# Patient Record
Sex: Female | Born: 1945 | Race: White | Hispanic: No | Marital: Married | Smoking: Never smoker
Health system: Southern US, Community
[De-identification: ages and names within clinical notes are randomized; demographics above are authoritative.]

## PROBLEM LIST (undated history)

## (undated) DIAGNOSIS — R112 Nausea with vomiting, unspecified: Secondary | ICD-10-CM

## (undated) DIAGNOSIS — J189 Pneumonia, unspecified organism: Secondary | ICD-10-CM

## (undated) DIAGNOSIS — Z9889 Other specified postprocedural states: Secondary | ICD-10-CM

## (undated) DIAGNOSIS — R252 Cramp and spasm: Secondary | ICD-10-CM

## (undated) DIAGNOSIS — E785 Hyperlipidemia, unspecified: Secondary | ICD-10-CM

## (undated) DIAGNOSIS — T8859XA Other complications of anesthesia, initial encounter: Secondary | ICD-10-CM

## (undated) DIAGNOSIS — K269 Duodenal ulcer, unspecified as acute or chronic, without hemorrhage or perforation: Secondary | ICD-10-CM

## (undated) DIAGNOSIS — M1990 Unspecified osteoarthritis, unspecified site: Secondary | ICD-10-CM

## (undated) DIAGNOSIS — M1711 Unilateral primary osteoarthritis, right knee: Secondary | ICD-10-CM

## (undated) DIAGNOSIS — R42 Dizziness and giddiness: Secondary | ICD-10-CM

## (undated) DIAGNOSIS — Z9289 Personal history of other medical treatment: Secondary | ICD-10-CM

## (undated) DIAGNOSIS — T4145XA Adverse effect of unspecified anesthetic, initial encounter: Secondary | ICD-10-CM

## (undated) DIAGNOSIS — E559 Vitamin D deficiency, unspecified: Secondary | ICD-10-CM

## (undated) HISTORY — DX: Vitamin D deficiency, unspecified: E55.9

## (undated) HISTORY — DX: Personal history of other medical treatment: Z92.89

## (undated) HISTORY — PX: LAPAROSCOPIC CHOLECYSTECTOMY: SUR755

## (undated) HISTORY — DX: Unspecified osteoarthritis, unspecified site: M19.90

## (undated) HISTORY — DX: Hyperlipidemia, unspecified: E78.5

## (undated) HISTORY — PX: JOINT REPLACEMENT: SHX530

## (undated) HISTORY — PX: TONSILLECTOMY: SUR1361

---

## 1998-12-22 ENCOUNTER — Other Ambulatory Visit: Admission: RE | Admit: 1998-12-22 | Discharge: 1998-12-22 | Payer: Self-pay | Admitting: Obstetrics and Gynecology

## 1999-08-09 ENCOUNTER — Other Ambulatory Visit: Admission: RE | Admit: 1999-08-09 | Discharge: 1999-08-09 | Payer: Self-pay | Admitting: Obstetrics and Gynecology

## 1999-08-09 ENCOUNTER — Encounter (INDEPENDENT_AMBULATORY_CARE_PROVIDER_SITE_OTHER): Payer: Self-pay | Admitting: Specialist

## 2000-01-23 ENCOUNTER — Other Ambulatory Visit: Admission: RE | Admit: 2000-01-23 | Discharge: 2000-01-23 | Payer: Self-pay | Admitting: Obstetrics and Gynecology

## 2000-03-13 ENCOUNTER — Encounter: Admission: RE | Admit: 2000-03-13 | Discharge: 2000-03-13 | Payer: Self-pay | Admitting: Obstetrics and Gynecology

## 2000-03-13 ENCOUNTER — Encounter: Payer: Self-pay | Admitting: Obstetrics and Gynecology

## 2000-03-16 ENCOUNTER — Encounter: Admission: RE | Admit: 2000-03-16 | Discharge: 2000-03-16 | Payer: Self-pay | Admitting: Obstetrics and Gynecology

## 2000-03-16 ENCOUNTER — Encounter: Payer: Self-pay | Admitting: Obstetrics and Gynecology

## 2001-01-14 ENCOUNTER — Other Ambulatory Visit: Admission: RE | Admit: 2001-01-14 | Discharge: 2001-01-14 | Payer: Self-pay | Admitting: Obstetrics and Gynecology

## 2001-05-17 ENCOUNTER — Encounter: Admission: RE | Admit: 2001-05-17 | Discharge: 2001-05-17 | Payer: Self-pay | Admitting: Obstetrics and Gynecology

## 2001-05-17 ENCOUNTER — Encounter: Payer: Self-pay | Admitting: Obstetrics and Gynecology

## 2002-01-29 ENCOUNTER — Other Ambulatory Visit: Admission: RE | Admit: 2002-01-29 | Discharge: 2002-01-29 | Payer: Self-pay | Admitting: Obstetrics and Gynecology

## 2002-09-25 ENCOUNTER — Encounter: Payer: Self-pay | Admitting: Obstetrics and Gynecology

## 2002-09-25 ENCOUNTER — Encounter: Admission: RE | Admit: 2002-09-25 | Discharge: 2002-09-25 | Payer: Self-pay | Admitting: Obstetrics and Gynecology

## 2003-04-20 ENCOUNTER — Other Ambulatory Visit: Admission: RE | Admit: 2003-04-20 | Discharge: 2003-04-20 | Payer: Self-pay | Admitting: Obstetrics and Gynecology

## 2003-11-23 ENCOUNTER — Encounter: Admission: RE | Admit: 2003-11-23 | Discharge: 2003-11-23 | Payer: Self-pay | Admitting: Obstetrics and Gynecology

## 2004-08-15 ENCOUNTER — Other Ambulatory Visit: Admission: RE | Admit: 2004-08-15 | Discharge: 2004-08-15 | Payer: Self-pay | Admitting: Obstetrics and Gynecology

## 2005-06-07 ENCOUNTER — Encounter: Admission: RE | Admit: 2005-06-07 | Discharge: 2005-06-07 | Payer: Self-pay | Admitting: Obstetrics and Gynecology

## 2005-06-07 ENCOUNTER — Ambulatory Visit: Payer: Self-pay | Admitting: Internal Medicine

## 2005-06-15 ENCOUNTER — Ambulatory Visit: Payer: Self-pay | Admitting: Internal Medicine

## 2005-07-03 ENCOUNTER — Ambulatory Visit: Payer: Self-pay

## 2005-10-12 ENCOUNTER — Other Ambulatory Visit: Admission: RE | Admit: 2005-10-12 | Discharge: 2005-10-12 | Payer: Self-pay | Admitting: Obstetrics and Gynecology

## 2005-11-06 HISTORY — PX: COLONOSCOPY: SHX174

## 2006-01-29 ENCOUNTER — Ambulatory Visit: Payer: Self-pay | Admitting: Internal Medicine

## 2006-07-18 ENCOUNTER — Encounter: Admission: RE | Admit: 2006-07-18 | Discharge: 2006-07-18 | Payer: Self-pay | Admitting: Internal Medicine

## 2006-07-24 ENCOUNTER — Ambulatory Visit: Payer: Self-pay | Admitting: Gastroenterology

## 2006-07-30 ENCOUNTER — Ambulatory Visit: Payer: Self-pay | Admitting: Gastroenterology

## 2006-08-16 ENCOUNTER — Ambulatory Visit: Payer: Self-pay | Admitting: Gastroenterology

## 2007-02-08 ENCOUNTER — Ambulatory Visit: Payer: Self-pay | Admitting: Internal Medicine

## 2007-03-20 ENCOUNTER — Encounter: Payer: Self-pay | Admitting: Internal Medicine

## 2007-03-20 ENCOUNTER — Ambulatory Visit: Payer: Self-pay | Admitting: Internal Medicine

## 2007-03-20 LAB — CONVERTED CEMR LAB
Alkaline Phosphatase: 64 units/L (ref 39–117)
Basophils Relative: 0.2 % (ref 0.0–1.0)
CO2: 30 meq/L (ref 19–32)
Chloride: 110 meq/L (ref 96–112)
Cholesterol: 244 mg/dL (ref 0–200)
Creatinine, Ser: 0.8 mg/dL (ref 0.4–1.2)
Eosinophils Absolute: 0.1 10*3/uL (ref 0.0–0.6)
GFR calc non Af Amer: 78 mL/min
HCT: 41.3 % (ref 36.0–46.0)
HDL: 45.5 mg/dL (ref >39.0)
Hemoglobin: 14.5 g/dL (ref 12.0–15.0)
Lymphocytes Relative: 39 % (ref 12.0–46.0)
MCHC: 35.1 g/dL (ref 30.0–36.0)
MCV: 92.4 fL (ref 78.0–100.0)
Neutro Abs: 2.9 10*3/uL (ref 1.4–7.7)
Neutrophils Relative %: 51.7 % (ref 43.0–77.0)
Platelets: 322 10*3/uL (ref 150–400)
Potassium: 4.6 meq/L (ref 3.5–5.1)
RDW: 11.9 % (ref 11.5–14.6)
Sodium: 144 meq/L (ref 135–145)
Total Bilirubin: 0.8 mg/dL (ref 0.3–1.2)
Triglycerides: 178 mg/dL — ABNORMAL HIGH (ref 0–149)

## 2007-06-27 ENCOUNTER — Ambulatory Visit: Payer: Self-pay | Admitting: Internal Medicine

## 2007-07-02 ENCOUNTER — Encounter (INDEPENDENT_AMBULATORY_CARE_PROVIDER_SITE_OTHER): Payer: Self-pay | Admitting: *Deleted

## 2007-07-02 LAB — CONVERTED CEMR LAB
Cholesterol: 135 mg/dL (ref 0–200)
HDL: 45.5 mg/dL (ref >39.0)
LDL Cholesterol: 67 mg/dL (ref 0–99)
Total CHOL/HDL Ratio: 3
Total CK: 98 units/L (ref 7–177)
VLDL: 23 mg/dL (ref 0–40)

## 2007-07-04 ENCOUNTER — Ambulatory Visit: Payer: Self-pay | Admitting: Internal Medicine

## 2007-07-04 DIAGNOSIS — E785 Hyperlipidemia, unspecified: Secondary | ICD-10-CM | POA: Insufficient documentation

## 2007-07-04 LAB — CONVERTED CEMR LAB: Cholesterol, target level: 200 mg/dL

## 2007-10-10 ENCOUNTER — Encounter: Admission: RE | Admit: 2007-10-10 | Discharge: 2007-10-10 | Payer: Self-pay | Admitting: Obstetrics and Gynecology

## 2007-10-10 ENCOUNTER — Encounter: Payer: Self-pay | Admitting: Internal Medicine

## 2009-01-01 ENCOUNTER — Encounter: Admission: RE | Admit: 2009-01-01 | Discharge: 2009-01-01 | Payer: Self-pay | Admitting: Orthopedic Surgery

## 2009-03-30 ENCOUNTER — Ambulatory Visit: Payer: Self-pay | Admitting: Internal Medicine

## 2009-03-30 DIAGNOSIS — IMO0001 Reserved for inherently not codable concepts without codable children: Secondary | ICD-10-CM

## 2009-03-30 DIAGNOSIS — E559 Vitamin D deficiency, unspecified: Secondary | ICD-10-CM | POA: Insufficient documentation

## 2009-07-05 ENCOUNTER — Ambulatory Visit: Payer: Self-pay | Admitting: Internal Medicine

## 2009-07-13 ENCOUNTER — Encounter (INDEPENDENT_AMBULATORY_CARE_PROVIDER_SITE_OTHER): Payer: Self-pay | Admitting: *Deleted

## 2009-07-13 LAB — CONVERTED CEMR LAB
ALT: 16 units/L (ref 0–35)
Albumin: 4.2 g/dL (ref 3.5–5.2)
BUN: 15 mg/dL (ref 6–23)
Basophils Absolute: 0 10*3/uL (ref 0.0–0.1)
Chloride: 109 meq/L (ref 96–112)
Creatinine, Ser: 0.7 mg/dL (ref 0.4–1.2)
GFR calc non Af Amer: 89.9 mL/min (ref >60)
Glucose, Bld: 89 mg/dL (ref 70–99)
Hemoglobin: 13.9 g/dL (ref 12.0–15.0)
Lymphocytes Relative: 37 % (ref 12.0–46.0)
Lymphs Abs: 2.1 10*3/uL (ref 0.7–4.0)
MCHC: 34.4 g/dL (ref 30.0–36.0)
Monocytes Absolute: 0.4 10*3/uL (ref 0.1–1.0)
Potassium: 4.4 meq/L (ref 3.5–5.1)
Sodium: 143 meq/L (ref 135–145)
Total Bilirubin: 0.9 mg/dL (ref 0.3–1.2)

## 2009-07-20 ENCOUNTER — Ambulatory Visit: Payer: Self-pay | Admitting: Internal Medicine

## 2009-07-20 DIAGNOSIS — M1990 Unspecified osteoarthritis, unspecified site: Secondary | ICD-10-CM | POA: Insufficient documentation

## 2009-09-06 HISTORY — PX: KNEE ARTHROSCOPY: SUR90

## 2009-11-06 DIAGNOSIS — Z9289 Personal history of other medical treatment: Secondary | ICD-10-CM

## 2009-11-06 HISTORY — DX: Personal history of other medical treatment: Z92.89

## 2009-12-02 ENCOUNTER — Encounter: Admission: RE | Admit: 2009-12-02 | Discharge: 2009-12-02 | Payer: Self-pay | Admitting: Orthopedic Surgery

## 2010-04-18 ENCOUNTER — Encounter: Admission: RE | Admit: 2010-04-18 | Discharge: 2010-04-18 | Payer: Self-pay | Admitting: Orthopedic Surgery

## 2010-05-27 ENCOUNTER — Ambulatory Visit: Payer: Self-pay | Admitting: Internal Medicine

## 2010-05-30 LAB — CONVERTED CEMR LAB
ALT: 22 units/L (ref 0–35)
AST: 27 units/L (ref 0–37)
Albumin: 4.1 g/dL (ref 3.5–5.2)
Alkaline Phosphatase: 71 units/L (ref 39–117)
Bilirubin, Direct: 0.1 mg/dL (ref 0.0–0.3)
Cholesterol: 170 mg/dL (ref 0–200)
HDL: 53.3 mg/dL (ref >39.00)
Sed Rate: 10 mm/hr (ref 0–22)
Total CHOL/HDL Ratio: 3
Triglycerides: 134 mg/dL (ref 0.0–149.0)
VLDL: 26.8 mg/dL (ref 0.0–40.0)
Vit D, 25-Hydroxy: 31 ng/mL (ref 30–89)

## 2010-05-31 ENCOUNTER — Telehealth (INDEPENDENT_AMBULATORY_CARE_PROVIDER_SITE_OTHER): Payer: Self-pay | Admitting: *Deleted

## 2010-06-30 ENCOUNTER — Telehealth (INDEPENDENT_AMBULATORY_CARE_PROVIDER_SITE_OTHER): Payer: Self-pay | Admitting: *Deleted

## 2010-06-30 ENCOUNTER — Encounter (INDEPENDENT_AMBULATORY_CARE_PROVIDER_SITE_OTHER): Payer: Self-pay | Admitting: *Deleted

## 2010-06-30 ENCOUNTER — Encounter: Payer: Self-pay | Admitting: Internal Medicine

## 2010-07-04 ENCOUNTER — Encounter (INDEPENDENT_AMBULATORY_CARE_PROVIDER_SITE_OTHER): Payer: Self-pay | Admitting: Orthopedic Surgery

## 2010-07-04 ENCOUNTER — Inpatient Hospital Stay (HOSPITAL_COMMUNITY): Admission: RE | Admit: 2010-07-04 | Discharge: 2010-07-07 | Payer: Self-pay | Admitting: Orthopedic Surgery

## 2010-07-05 HISTORY — PX: TOTAL HIP ARTHROPLASTY: SHX124

## 2010-11-27 ENCOUNTER — Encounter: Payer: Self-pay | Admitting: Internal Medicine

## 2010-12-08 NOTE — Progress Notes (Signed)
Summary: Mis-Placed RX  Phone Note Call from Patient Call back at Home Phone [phone]   Caller: Patient Summary of Call: Patient mis-placed rx for meloxicam and would like another rx.  Patient informed rx will be avaliable after noon./Chrae [facility] CMA  May 31, 2010 9:42 AM     Prescriptions: MELOXICAM 15 MG TABS (MELOXICAM) 1 by mouth once daily  #30 x 2   Entered by:   Shonna Chock CMA   Authorized by:   Marga Melnick MD   Signed by:   Shonna Chock CMA on 05/31/2010   Method used:   Print then Give to Patient   RxID:   [id number]

## 2010-12-08 NOTE — Assessment & Plan Note (Signed)
Summary: hip pain/knee pain/cbs   Vital Signs:  Patient profile:   65 year old female Weight:      178 pounds BMI:     31.65 Temp:     98.4 degrees F oral Pulse rate:   80 / minute Resp:     15 per minute BP sitting:   122 / 80  (left arm) Cuff size:   large  Vitals Entered By: Shonna Chock CMA (May 27, 2010 8:15 AM) CC: Knee and him pain, Lower Extremity Joint pain   CC:  Knee and him pain and Lower Extremity Joint pain.  History of Present Illness:  Lower Extremity Joint Pains      This is a 65 year old woman who presents with Lower Extremity Joint pains, present 11 months, progressive over past 3 months.  The patient reports R knee  giving way  ,swelling &  popping L knee, stiffness for >1 hr, decreased ROM, and weakness, but denies redness,  and locking.  The pain began gradually ; she tore L knee meniscus from  injury climbing ladder & twisting it.  The pains are  described as variable from sharp to  dull but is constant, but   mainly activity exacerbated.  Evaluation to date has included plain X-rays, MRI scan, and Orthopedic consult. Atrthroscopy L knee 09/2009 by Dr. Thurston Hole. He described "bone on bone".  The patient denies the following symptoms: fever, rash, photosensitivity, eye symptoms, diarrhea, and dysuria.  Rx:S/P steroid injections X 4 (2 in R  hip & 2 in L knee in period 08/10-05/11) Meloxicam helps; Glucosamine hasn't X 1 month .Her MGM had arthritis. Black Box Warning reviewed. She has been intolerant to Tramadol.BMD WNL @ Dr Manus Gunning of vit D deficiency. Note: on Lipitor every other day  which decreased leg cramps. She inquires as to possibility of MS or MD. She questions benefit  of trial  of Acupuncture  Current Medications (verified): 1)  Lipitor 20 Mg  Tabs (Atorvastatin Calcium) .Marland Kitchen.. 1 Every Other Day 2)  Prempro 0.3-1.5 Mg  Tabs (Conj Estrog-Medroxyprogest Ace) 3)  Centrum Silver 4)  Aspirin 81 Mg Tabs (Aspirin) .Marland Kitchen.. 1 By Mouth Once Daily 5)  Meloxicam 15 Mg  Tabs (Meloxicam) .Marland Kitchen.. 1 By Mouth Once Daily 6)  Tylenol Arthritis Pain 650 Mg Cr-Tabs (Acetaminophen) .... 2 Twice Weekly  Allergies: 1)  ! Pcn 2)  ! Tramadol Hcl (Tramadol Hcl)  Review of Systems Eyes:  Denies discharge, eye pain, and red eye. GU:  Denies discharge and hematuria. MS:  Complains of muscle aches; "Legs like cement @ night".  Physical Exam  General:  well-nourished,in no acute distress; alert,appropriate and cooperative throughout examination Lungs:  Normal respiratory effort, chest expands symmetrically. Lungs are clear to auscultation, no crackles or wheezes. Heart:  Normal rate and regular rhythm. S1 and S2 normal without gallop, murmur, click, rub . S4 Pulses:  R and L carotid,radial,dorsalis pedis and posterior tibial pulses are full and equal bilaterally Extremities:  No clubbing, cyanosis, edema. Crepitus of knees w/o effusion Fusiform L knee Neurologic:  alert & oriented X3, strength normal in all extremities, and DTRs symmetrical and normal.   Skin:  Intact without suspicious lesions or rashes Cervical Nodes:  No lymphadenopathy noted Axillary Nodes:  No palpable lymphadenopathy   Impression & Recommendations:  Problem # 1:  OSTEOARTHRITIS (ICD-715.90)  Her updated medication list for this problem includes:    Aspirin 81 Mg Tabs (Aspirin) .Marland Kitchen... 1 by mouth once daily  Meloxicam 15 Mg Tabs (Meloxicam) .Marland Kitchen... 1 by mouth once daily    Tylenol Arthritis Pain 650 Mg Cr-tabs (Acetaminophen) .Marland Kitchen... 2 twice weekly  Orders: Rheumatology Referral (Rheumatology) Venipuncture (16109) TLB-Rheumatoid Factor (RA) (60454-UJ) TLB-Sedimentation Rate (ESR) (85652-ESR)  Problem # 2:  MUSCLE PAIN (ICD-729.1)  Her updated medication list for this problem includes:    Aspirin 81 Mg Tabs (Aspirin) .Marland Kitchen... 1 by mouth once daily    Meloxicam 15 Mg Tabs (Meloxicam) .Marland Kitchen... 1 by mouth once daily    Tylenol Arthritis Pain 650 Mg Cr-tabs (Acetaminophen) .Marland Kitchen... 2 twice  weekly  Orders: Rheumatology Referral (Rheumatology) Venipuncture (81191) TLB-CK Total Only(Creatine Kinase/CPK) (82550-CK)  Problem # 3:  VITAMIN D DEFICIENCY (ICD-268.9)  Orders: Venipuncture (47829) T-Vitamin D (25-Hydroxy) (56213-08657)  Problem # 4:  HYPERLIPIDEMIA NEC/NOS (ICD-272.4)  Her updated medication list for this problem includes:    Lipitor 20 Mg Tabs (Atorvastatin calcium) .Marland Kitchen... 1 every other day  Orders: Venipuncture (84696) TLB-Lipid Panel (80061-LIPID) TLB-Hepatic/Liver Function Pnl (80076-HEPATIC)  Complete Medication List: 1)  Lipitor 20 Mg Tabs (Atorvastatin calcium) .Marland Kitchen.. 1 every other day 2)  Prempro 0.3-1.5 Mg Tabs (Conj estrog-medroxyprogest ace) 3)  Centrum Silver  4)  Aspirin 81 Mg Tabs (Aspirin) .Marland Kitchen.. 1 by mouth once daily 5)  Meloxicam 15 Mg Tabs (Meloxicam) .Marland Kitchen.. 1 by mouth once daily 6)  Tylenol Arthritis Pain 650 Mg Cr-tabs (Acetaminophen) .... 2 twice weekly  Patient Instructions: 1)  Glucosamine trial is 3 months on & 2 months off. You do not have MS or MD. Prescriptions: MELOXICAM 15 MG TABS (MELOXICAM) 1 by mouth once daily  #30 x 2   Entered and Authorized by:   Marga Melnick MD   Signed by:   Marga Melnick MD on 05/27/2010   Method used:   Print then Give to Patient   RxID:   [id number]   Appended Document: hip pain/knee pain/cbs    Clinical Lists Changes  Orders: Added new Service order of Specimen Handling (25366) - Signed

## 2010-12-08 NOTE — Letter (Signed)
Summary: Generic Letter  [facility] [place]  [address] [place], [place] [address]   Phone: [phone]  Fax: [phone]    06/30/2010  Heather Armstrong [address] [place], [place]  [address]  To whom it may concern:  The above named patient is cleared for surgery medically. Fasting hepatic panel and Lipid panel requested while in hospital (272.4,995.20)           Sincerely,       Oren Bracket

## 2010-12-08 NOTE — Progress Notes (Signed)
Summary: NEEDS SURG CLEARANCE ASAP!!  Phone Note Call from Patient   Caller: KATHY AT DR Thurston Hole OFFICE = [phone] Summary of Call: KATHY FROM DR Sherene Sires OFFICE ( [phone]) CALLED TO REPORT THAT PATIENT'S RIGHT HIP REPLACEMENT SURGERY HAS BEEN MOVED UP TO THIS MONDAY 8/29---SHE ASKED FOR LAST LAB WORK , OFFICE VIST AND EKG --I WILL FAX ALL THAT INFO TO HER--GOT "OK" FOR FAX CONFIRMATION  SHE ASKED IF DR HOPPER COULD HAND WRITE ON A PRESCRIPTION PAD THAT "PATIENT IS CLEAR FOR SURGERY" AND FAX BACK TO KATHY AT DR [facility] OFFICE AT FAX NUMBER [phone]  IF DR HOPPER HAS ANY CONCERNS, PLEASE CALL KATHY AT [phone] [phone] Initial call taken by: Jerolyn Shin,  June 30, 2010 11:40 AM  Follow-up for Phone Call        Note printed and placed on Dr.Hopper's  ledge for review Follow-up by: Shonna Chock CMA,  June 30, 2010 11:47 AM  Additional Follow-up for Phone Call Additional follow up Details #1::        Patient is cleared for surgery medically. Fasing hepatic panel & Lipid panel requested while in hospital(272.4, 995.20).  Additional Follow-up by: Marga Melnick MD,  June 30, 2010 1:04 PM    Additional Follow-up for Phone Call Additional follow up Details #2::    left detail message with the following per dr hopper, letter printed and placed on ledge to be faxed pending signature....Marland KitchenMarland KitchenFelecia Deloach CMA  June 30, 2010 2:06 PM    I faxed letter x 2 to the provided number and it keeps saying re-dial . I called Olegario Messier and left message for her to call with an alternate fax number or to verify fax number listed is correct./Chrae [facility] CMA  June 30, 2010 2:47 PM   Olegario Messier called me back and informed be that she received letter both times./Chrae [facility] CMA  June 30, 2010 4:20 PM

## 2010-12-08 NOTE — Letter (Signed)
Summary: Surgical Clearance Letter/[facility]  Surgical Clearance Letter/ [facility]   Imported By: Lanelle Bal 07/07/2010 11:03:50  _____________________________________________________________________  External Attachment:    Type:   Image     Comment:   External Document

## 2011-01-19 LAB — COMPREHENSIVE METABOLIC PANEL
ALT: 25 U/L (ref 0–35)
AST: 23 U/L (ref 0–37)
Albumin: 2.5 g/dL — ABNORMAL LOW (ref 3.5–5.2)
Alkaline Phosphatase: 57 U/L (ref 39–117)
CO2: 31 mEq/L (ref 19–32)
Calcium: 7.8 mg/dL — ABNORMAL LOW (ref 8.4–10.5)
Chloride: 105 mEq/L (ref 96–112)
GFR calc non Af Amer: >60 mL/min (ref >60)
Glucose, Bld: 104 mg/dL — ABNORMAL HIGH (ref 70–99)
Potassium: 3.9 mEq/L (ref 3.5–5.1)
Total Protein: 5 g/dL — ABNORMAL LOW (ref 6.0–8.3)

## 2011-01-19 LAB — CBC
MCH: 31.1 pg (ref 26.0–34.0)
Platelets: 200 10*3/uL (ref 150–400)
RBC: 3.15 MIL/uL — ABNORMAL LOW (ref 3.87–5.11)
RDW: 13.7 % (ref 11.5–15.5)

## 2011-01-19 LAB — PROTIME-INR
INR: 1.93 — ABNORMAL HIGH (ref 0.00–1.49)
Prothrombin Time: 22.2 seconds — ABNORMAL HIGH (ref 11.6–15.2)

## 2011-01-20 LAB — COMPREHENSIVE METABOLIC PANEL
ALT: 24 U/L (ref 0–35)
ALT: 33 U/L (ref 0–35)
AST: 26 U/L (ref 0–37)
Alkaline Phosphatase: 54 U/L (ref 39–117)
BUN: 11 mg/dL (ref 6–23)
CO2: 26 mEq/L (ref 19–32)
CO2: 28 mEq/L (ref 19–32)
Calcium: 9.8 mg/dL (ref 8.4–10.5)
Chloride: 106 mEq/L (ref 96–112)
GFR calc Af Amer: >60 mL/min (ref >60)
GFR calc Af Amer: >60 mL/min (ref >60)
GFR calc non Af Amer: >60 mL/min (ref >60)
Glucose, Bld: 123 mg/dL — ABNORMAL HIGH (ref 70–99)
Glucose, Bld: 90 mg/dL (ref 70–99)
Sodium: 137 mEq/L (ref 135–145)
Sodium: 140 mEq/L (ref 135–145)
Total Bilirubin: 0.4 mg/dL (ref 0.3–1.2)
Total Bilirubin: 0.6 mg/dL (ref 0.3–1.2)

## 2011-01-20 LAB — CBC
HCT: 24.2 % — ABNORMAL LOW (ref 36.0–46.0)
HCT: 26.4 % — ABNORMAL LOW (ref 36.0–46.0)
Hemoglobin: 13.5 g/dL (ref 12.0–15.0)
Hemoglobin: 8 g/dL — ABNORMAL LOW (ref 12.0–15.0)
MCH: 31.5 pg (ref 26.0–34.0)
MCH: 31.8 pg (ref 26.0–34.0)
MCHC: 33.1 g/dL (ref 30.0–36.0)
MCHC: 33.4 g/dL (ref 30.0–36.0)
MCV: 94.3 fL (ref 78.0–100.0)
MCV: 97.1 fL (ref 78.0–100.0)
Platelets: 233 10*3/uL (ref 150–400)
RBC: 2.54 MIL/uL — ABNORMAL LOW (ref 3.87–5.11)
RBC: 4.16 MIL/uL (ref 3.87–5.11)
RDW: 12.4 % (ref 11.5–15.5)
WBC: 7.8 10*3/uL (ref 4.0–10.5)

## 2011-01-20 LAB — PROTIME-INR
INR: 0.95 (ref 0.00–1.49)
INR: 1.49 (ref 0.00–1.49)
Prothrombin Time: 18.2 seconds — ABNORMAL HIGH (ref 11.6–15.2)

## 2011-01-20 LAB — URINE CULTURE
Colony Count: NO GROWTH
Culture  Setup Time: 201108261754
Culture: NO GROWTH

## 2011-01-20 LAB — DIFFERENTIAL
Basophils Absolute: 0.1 10*3/uL (ref 0.0–0.1)
Eosinophils Absolute: 0.2 10*3/uL (ref 0.0–0.7)
Eosinophils Relative: 2 % (ref 0–5)
Lymphs Abs: 2.2 10*3/uL (ref 0.7–4.0)
Monocytes Absolute: 0.4 10*3/uL (ref 0.1–1.0)
Monocytes Relative: 6 % (ref 3–12)
Neutro Abs: 4.1 10*3/uL (ref 1.7–7.7)

## 2011-01-20 LAB — URINALYSIS, ROUTINE W REFLEX MICROSCOPIC
Bilirubin Urine: NEGATIVE
Hgb urine dipstick: NEGATIVE
Ketones, ur: NEGATIVE mg/dL
Nitrite: NEGATIVE
Urobilinogen, UA: 0.2 mg/dL (ref 0.0–1.0)
pH: 5.5 (ref 5.0–8.0)

## 2011-01-20 LAB — BASIC METABOLIC PANEL
BUN: 12 mg/dL (ref 6–23)
CO2: 26 mEq/L (ref 19–32)
Chloride: 103 mEq/L (ref 96–112)
Creatinine, Ser: 0.81 mg/dL (ref 0.4–1.2)
Potassium: 5 mEq/L (ref 3.5–5.1)

## 2011-01-20 LAB — TYPE AND SCREEN: ABO/RH(D): O POS

## 2011-01-20 LAB — HEMOGLOBIN AND HEMATOCRIT, BLOOD
HCT: 25.5 % — ABNORMAL LOW (ref 36.0–46.0)
Hemoglobin: 8.6 g/dL — ABNORMAL LOW (ref 12.0–15.0)

## 2011-01-20 LAB — LIPID PANEL: Cholesterol: 118 mg/dL (ref 0–200)

## 2011-01-20 LAB — PREPARE RBC (CROSSMATCH)

## 2011-03-24 NOTE — Assessment & Plan Note (Signed)
[facility]                           GASTROENTEROLOGY OFFICE NOTE   Heather Armstrong, Heather Armstrong                     MRN:          [id number]  DATE:08/16/2006                            DOB:          11-20-1945    HISTORY:  Heather Armstrong had a colonoscopy which revealed mild proctitis. She is  currently asymptomatic after doing several weeks of Pentasa suppositories.  We will see how she does symptomatically, with follow up in the future as  needed.   Vital signs today were all unremarkable .       Vania Rea. Jarold Motto, MD, Clementeen Graham, [place]      DRP/MedQ  DD:  08/17/2006  DT:  08/18/2006  Job #:  [id number]

## 2011-10-10 ENCOUNTER — Ambulatory Visit (INDEPENDENT_AMBULATORY_CARE_PROVIDER_SITE_OTHER): Payer: Medicare Other

## 2011-10-10 DIAGNOSIS — Z23 Encounter for immunization: Secondary | ICD-10-CM

## 2011-11-07 HISTORY — PX: POLYPECTOMY: SHX149

## 2011-11-16 ENCOUNTER — Other Ambulatory Visit: Payer: Self-pay | Admitting: Obstetrics and Gynecology

## 2012-05-29 ENCOUNTER — Other Ambulatory Visit: Payer: Self-pay | Admitting: Obstetrics and Gynecology

## 2012-08-13 ENCOUNTER — Encounter: Payer: Self-pay | Admitting: Internal Medicine

## 2012-08-13 ENCOUNTER — Ambulatory Visit (INDEPENDENT_AMBULATORY_CARE_PROVIDER_SITE_OTHER): Payer: Medicare Other | Admitting: Internal Medicine

## 2012-08-13 VITALS — BP 138/80 | HR 71 | Temp 98.5°F | Resp 12 | Ht 63.5 in | Wt 180.0 lb

## 2012-08-13 DIAGNOSIS — Z23 Encounter for immunization: Secondary | ICD-10-CM

## 2012-08-13 DIAGNOSIS — Z780 Asymptomatic menopausal state: Secondary | ICD-10-CM

## 2012-08-13 DIAGNOSIS — E559 Vitamin D deficiency, unspecified: Secondary | ICD-10-CM

## 2012-08-13 DIAGNOSIS — Z0000 Encounter for general adult medical examination without abnormal findings: Secondary | ICD-10-CM

## 2012-08-13 DIAGNOSIS — E785 Hyperlipidemia, unspecified: Secondary | ICD-10-CM

## 2012-08-13 NOTE — Patient Instructions (Addendum)
Preventive Health Care: Exercise  30-45  minutes a day, 3-4 days a week. Walking is especially valuable in preventing Osteoporosis. Eat a low-fat diet with lots of fruits and vegetables, up to 7-9 servings per day.  The most common cause of elevated triglycerides is the ingestion of sugar from high fructose corn syrup sources added to processed foods & drinks.  Eat a low-fat diet with lots of fruits and vegetables, up to 7-9 servings per day. Consume less than 30 (preferably ZERO) grams of sugar per day from foods & drinks with High Fructose Corn Syrup (HFCS) sugar as #1,2,3 or # 4 on label.Whole Foods, [employer] & [employer] do not carry products with HFCS. Health Care Power of Attorney & Living Will place you in charge of your health care  decisions. Verify these are  in place. Add 1000 IU vitamin D3 1 pill @ least 3X/ week  to present MVI.   If you activate My Chart; the results can be released to you as soon as they populate from the lab. If you choose not to use this program; the labs have to be reviewed, copied & mailed   causing a delay in getting the results to you.

## 2012-08-13 NOTE — Progress Notes (Signed)
Subjective:    Patient ID: Heather Armstrong, female    DOB: 05-28-1946, 66 y.o.   MRN: [id number]  HPI Medicare Wellness Visit:  The following psychosocial & medical history were reviewed as required by Medicare.   Social history: caffeine: 2 cups coffee & 2 large teas / day  , alcohol: no ,  tobacco use : never  & exercise : minimal except for climbing stairs @ work & walking occasionally.   Home & personal  safety / fall risk: no issues, activities of daily living: no limitations , seatbelt use : yes , and smoke alarm employment : yes .  Power of Attorney/Living Will status : NO  Vision ( as recorded per Nurse) & Hearing  evaluation :  Ophth exam 2/13; no hearing exam. Orientation :oriented X 3 , memory & recall :good,  math testing: good,and mood & affect : good . Depression / anxiety: denied Travel history : 2003 [place] , immunization status :Flu today , transfusion history: 2 units 2011, and preventive health surveillance ( colonoscopies, BMD , etc as per protocol/ [facility]): colonoscopy up to date, Dental care:  Every 6 mos . Chart reviewed &  Updated. Active issues reviewed & addressed.       Review of Systems Labs performed 06/03/12 @ her gynecologist's office were reviewed. Her LDL was 130 on atorvastatin 20 mg every other day. Based on the advanced cholesterol testing; her LDL goal is less than 100, ideally less than 70. HDL was protective at 51. The triglycerides were elevated to 218; she has reduced sugar intake since that study & increased statin to 20 mg daily. Her A1c was in the nondiabetic range at 5.8%. Her mother may have had a heart attack at 84.   She has significant flashes at night associated with a pounding heart. Clonidine has been of benefit in treating these postmenopausal symptoms.  Her vitamin D level was low normal to reduced at 34. Her only source of vitamin D is multivitamin. Her serum calcium level was high normal at 10. She is overdue for bone mineral density  followup     Objective:   Physical Exam Gen.: well-nourished in appearance. Alert, appropriate and cooperative throughout exam. Head: Normocephalic without obvious abnormalities  Eyes: No corneal or conjunctival inflammation noted. Pupils equal round reactive to light and accommodation. Fundal exam is benign without hemorrhages, exudate, papilledema. Extraocular motion intact. Vision grossly normal. Ears: External  ear exam reveals no significant lesions or deformities. Canals clear .TMs normal. Hearing is grossly normal bilaterally. Nose: External nasal exam reveals no deformity or inflammation. Nasal mucosa are pink and moist. No lesions or exudates noted.  Mouth: Oral mucosa and oropharynx reveal no lesions or exudates. Teeth in good repair. Neck: No deformities, masses, or tenderness noted. Range of motion & Thyroid normal. Lungs: Normal respiratory effort; chest expands symmetrically. Lungs are clear to auscultation without rales, wheezes, or increased work of breathing. Heart: Normal rate and rhythm. Normal S1 and S2. No gallop, click, or rub. S4 w/o murmur. Abdomen: Bowel sounds normal; abdomen soft and nontender. No masses, organomegaly or hernias noted. Genitalia: Dr Vincente Poli, Gyn                                                  Musculoskeletal/extremities: Mild lordosis  noted of  the thoracic or lumbar spine. No clubbing,  cyanosis, edema, or deformity noted. Range of motion  normal .Tone & strength  normal.Joints normal. Nail health  Good.Minor crepitus of knees Vascular: Carotid, radial artery, dorsalis pedis and  posterior tibial pulses are full and equal. No bruits present. Neurologic: Alert and oriented x3. Deep tendon reflexes symmetrical and normal.         Skin: Intact without suspicious lesions or rashes. Lymph: No cervical, axillary lymphadenopathy present. Psych: Mood and affect are normal. Normally interactive                                                                                         Assessment & Plan:  #1 Medicare Wellness Exam; criteria met ; data entered #2 Problem List reviewed ; Assessment/ Recommendations made Plan: see Orders

## 2012-08-14 LAB — LIPID PANEL
HDL: 46.4 mg/dL (ref >39.00)
LDL Cholesterol: 64 mg/dL (ref 0–99)
Total CHOL/HDL Ratio: 3
Triglycerides: 131 mg/dL (ref 0.0–149.0)
VLDL: 26.2 mg/dL (ref 0.0–40.0)

## 2012-08-14 LAB — HEPATIC FUNCTION PANEL
Alkaline Phosphatase: 72 U/L (ref 39–117)
Bilirubin, Direct: 0.2 mg/dL (ref 0.0–0.3)
Total Bilirubin: 0.9 mg/dL (ref 0.3–1.2)
Total Protein: 7.5 g/dL (ref 6.0–8.3)

## 2012-09-06 HISTORY — PX: EYE SURGERY: SHX253

## 2012-12-17 ENCOUNTER — Other Ambulatory Visit: Payer: Self-pay | Admitting: Internal Medicine

## 2012-12-17 DIAGNOSIS — E785 Hyperlipidemia, unspecified: Secondary | ICD-10-CM

## 2012-12-17 MED ORDER — ATORVASTATIN CALCIUM 20 MG PO TABS: 20.0000 mg | ORAL_TABLET | Freq: Every day | ORAL | Status: DC

## 2013-06-23 ENCOUNTER — Other Ambulatory Visit: Payer: Self-pay | Admitting: Obstetrics and Gynecology

## 2013-08-19 ENCOUNTER — Telehealth: Payer: Self-pay

## 2013-08-19 NOTE — Telephone Encounter (Signed)
Medication and allergies: done  n/a for 90 day supply (mail order) [facility] [address] for local pharmacy   Immunizations due: Zostavax, flu offered  A/P:  Last:  PAP:  UTD per pt getting report MMG: UTD 06/2013  Dexa: UTD 06/2013 CCS: UTD per pt getting report DM: n/a HTN: due Lipids: n/a  To Discuss with Provider: Patient has experienced several ovarian cyst over last year, had several [place]; wanted to discuss  Getting reports so they can all be in her chart here

## 2013-08-20 ENCOUNTER — Ambulatory Visit (INDEPENDENT_AMBULATORY_CARE_PROVIDER_SITE_OTHER): Payer: Medicare Other | Admitting: Internal Medicine

## 2013-08-20 ENCOUNTER — Telehealth: Payer: Self-pay | Admitting: *Deleted

## 2013-08-20 ENCOUNTER — Encounter: Payer: Self-pay | Admitting: Internal Medicine

## 2013-08-20 VITALS — BP 113/70 | HR 70 | Temp 98.5°F | Ht 63.0 in | Wt 184.8 lb

## 2013-08-20 DIAGNOSIS — Z0000 Encounter for general adult medical examination without abnormal findings: Secondary | ICD-10-CM

## 2013-08-20 DIAGNOSIS — E559 Vitamin D deficiency, unspecified: Secondary | ICD-10-CM

## 2013-08-20 DIAGNOSIS — E785 Hyperlipidemia, unspecified: Secondary | ICD-10-CM

## 2013-08-20 LAB — HEPATIC FUNCTION PANEL
ALT: 18 U/L (ref 0–35)
AST: 26 U/L (ref 0–37)
Albumin: 4.3 g/dL (ref 3.5–5.2)
Alkaline Phosphatase: 77 U/L (ref 39–117)
Bilirubin, Direct: 0 mg/dL (ref 0.0–0.3)
Total Bilirubin: 0.5 mg/dL (ref 0.3–1.2)
Total Protein: 7.3 g/dL (ref 6.0–8.3)

## 2013-08-20 LAB — BASIC METABOLIC PANEL
BUN: 17 mg/dL (ref 6–23)
CO2: 28 mEq/L (ref 19–32)
Calcium: 9.5 mg/dL (ref 8.4–10.5)
Chloride: 105 mEq/L (ref 96–112)
Creatinine, Ser: 0.8 mg/dL (ref 0.4–1.2)
GFR: 79.5 mL/min (ref >60.00)
Glucose, Bld: 86 mg/dL (ref 70–99)
Potassium: 4.3 mEq/L (ref 3.5–5.1)
Sodium: 142 mEq/L (ref 135–145)

## 2013-08-20 LAB — LIPID PANEL
Cholesterol: 161 mg/dL (ref 0–200)
HDL: 58.1 mg/dL (ref >39.00)

## 2013-08-20 LAB — TSH: TSH: 3.48 u[IU]/mL (ref 0.35–5.50)

## 2013-08-20 MED ORDER — ATORVASTATIN CALCIUM 20 MG PO TABS: 20.0000 mg | ORAL_TABLET | Freq: Every day | ORAL | Status: DC

## 2013-08-20 NOTE — Patient Instructions (Signed)
As per the Standard of Care , screening Colonoscopy recommended @ 50 & every 5-10 years thereafter . More frequent monitor would be dictated by family history or findings @ Colonoscopy  If you activate the  My Chart system; lab & Xray results will be released directly  to you as soon as I review & address these through the computer. If you choose not to sign up for My Chart within 36 hours of labs being drawn; results will be reviewed & interpretation added before being copied & mailed, causing a delay in getting the results to you.If you do not receive that report within 7-10 days ,please call. Additionally you can use this system to gain direct  access to your records  if  out of town or @ an office of a  physician who is not in  the My Chart network.  This improves continuity of care & places you in control of your medical record.  

## 2013-08-20 NOTE — Progress Notes (Signed)
Subjective:    Patient ID: Heather Armstrong, female    DOB: 1946/01/18, 67 y.o.   MRN: [id number]  HPI Medicare Wellness Visit: Psychosocial and medical history were reviewed as required by Medicare (abuse, antisocial behavior risk, forearm risk). Social history: Caffeine: 2 cups coffee & 3 glasses tea / day , Alcohol: no , Tobacco use:no Exercise: see below Personal safety/fall risk:no Limitations of activities of daily living:no Seatbelt smoke alarm use:yes Healthcare Power of Attorney/Living Will status: in place Ophthalmologic exam status:current Hearing evaluation status: not current Orientation: Oriented X3 Memory and recall: good Spelling  testing: good Depression/anxiety assessment: denied Foreign travel history:[place] 2014 Immunization status the shingles/bleeding/pneumonia/tetanus: PNA & shingles needed Transfusion history:3 units post THR 2011 Preventive health care maintenance status: Colonoscopy/BMD/mammogram/Pap as per protocol/standard care:? Colonoscopy needed Dental care:every 6 mos Chart reviewed and updated. Active issues reviewed and addressed.    Review of Systems She is on a modified heart healthy diet; she exercises as treadmill & weights 30-45 minutes 2 times per week without symptoms. Specifically she denies chest pain,  dyspnea, or claudication with CVE. Palpitations with hot flashes.  Family history is positive for premature coronary disease in he mother. Advanced cholesterol testing reveals her LDL goal is less than 100. There is medication compliance with the statin every other day. Significant abdominal symptoms, memory deficit, or myalgias denied. .    Objective:   Physical Exam Gen.: Healthy and well-nourished in appearance. Alert, appropriate and cooperative throughout exam.Appears younger than stated age  Head: Normocephalic without obvious abnormalities  Eyes: No corneal or conjunctival inflammation noted. Pupils equal round reactive to light and  accommodation.  Extraocular motion intact.  Ears: External  ear exam reveals no significant lesions or deformities. Canals clear .TMs normal. Hearing is grossly normal bilaterally. Nose: External nasal exam reveals no deformity or inflammation. Nasal mucosa are pink and moist. No lesions or exudates noted.  Mouth: Oral mucosa and oropharynx reveal no lesions or exudates. Teeth in good repair. Neck: No deformities, masses, or tenderness noted. Range of motion good. Thyroid : R lobe > L. Lungs: Normal respiratory effort; chest expands symmetrically. Lungs are clear to auscultation without rales, wheezes, or increased work of breathing. Heart: Normal rate and rhythm. Normal S1 and S2. No gallop, click, or rub. No murmur. Abdomen: Bowel sounds normal; abdomen soft and nontender. No masses, organomegaly or hernias noted. Genitalia: As per Gyn                                  Musculoskeletal/extremities: Minimally accentuated curvature of upper thoracic  Spine.  No clubbing, cyanosis, edema, or significant extremity  deformity noted. Range of motion normal .Tone & strength  Normal. Joints  reveal mild  DJD DIP changes. Nail health good. Able to lie down & sit up w/o help. Negative SLR bilaterally. Crepitus of knees Vascular: Carotid, radial artery, dorsalis pedis and  posterior tibial pulses are full and equal. No bruits present. Neurologic: Alert and oriented x3. Deep tendon reflexes symmetrical and normal.         Skin: Intact without suspicious lesions or rashes. Lymph: No cervical, axillary lymphadenopathy present. Psych: Mood and affect are normal. Normally interactive  Assessment & Plan:  #1 Medicare Wellness Exam; criteria met ; data entered #2 Problem List/Diagnoses reviewed Plan:  Assessments made/ Orders entered  

## 2013-08-20 NOTE — Telephone Encounter (Signed)
Pt had a COLON in 06/25/98, 06/07/03, 07/08/2006 and I can find nothing since. I can find no recall letters or correspondence in the chart, so our office feels she needs a repeat COLON. lmom for pt to call back.

## 2013-08-20 NOTE — Telephone Encounter (Signed)
Message copied by Florene Glen on Wed Aug 20, 2013 10:36 AM ------      Message from: Jarold Motto, DAVID R      Created: Wed Aug 20, 2013  9:54 AM       Appears to be due per her age...will ask my nurse to schedule      ----- Message -----         From: Pecola Lawless, MD         Sent: 08/20/2013   9:26 AM           To: Mardella Layman, MD            Please verify when follow up colonoscopy is due based on your records. Thanks, Hopp       ------

## 2013-08-24 LAB — VITAMIN D 1,25 DIHYDROXY
Vitamin D2 1, 25 (OH)2: <8 pg/mL
Vitamin D3 1, 25 (OH)2: 57 pg/mL

## 2013-08-26 ENCOUNTER — Ambulatory Visit (INDEPENDENT_AMBULATORY_CARE_PROVIDER_SITE_OTHER): Payer: Medicare Other

## 2013-08-26 DIAGNOSIS — Z23 Encounter for immunization: Secondary | ICD-10-CM

## 2013-08-28 NOTE — Telephone Encounter (Signed)
Pt never returned my call; wrote her a letter asking her to call us.

## 2013-08-28 NOTE — Telephone Encounter (Signed)
No records ???? Needs ov

## 2013-09-02 ENCOUNTER — Encounter: Payer: Self-pay | Admitting: Gastroenterology

## 2013-09-25 ENCOUNTER — Other Ambulatory Visit: Payer: Self-pay | Admitting: Internal Medicine

## 2013-09-26 ENCOUNTER — Ambulatory Visit (INDEPENDENT_AMBULATORY_CARE_PROVIDER_SITE_OTHER): Payer: Medicare Other | Admitting: Internal Medicine

## 2013-09-26 ENCOUNTER — Encounter: Payer: Self-pay | Admitting: Internal Medicine

## 2013-09-26 ENCOUNTER — Ambulatory Visit (HOSPITAL_BASED_OUTPATIENT_CLINIC_OR_DEPARTMENT_OTHER)
Admission: RE | Admit: 2013-09-26 | Discharge: 2013-09-26 | Disposition: A | Discharge status: Home / Self Care | Payer: Medicare Other | Source: Ambulatory Visit | Attending: Internal Medicine | Admitting: Internal Medicine

## 2013-09-26 VITALS — BP 135/79 | HR 85 | Temp 97.9°F | Ht 63.0 in | Wt 187.4 lb

## 2013-09-26 DIAGNOSIS — I6523 Occlusion and stenosis of bilateral carotid arteries: Secondary | ICD-10-CM

## 2013-09-26 DIAGNOSIS — I6529 Occlusion and stenosis of unspecified carotid artery: Secondary | ICD-10-CM

## 2013-09-26 DIAGNOSIS — R209 Unspecified disturbances of skin sensation: Secondary | ICD-10-CM

## 2013-09-26 DIAGNOSIS — E785 Hyperlipidemia, unspecified: Secondary | ICD-10-CM

## 2013-09-26 DIAGNOSIS — R202 Paresthesia of skin: Secondary | ICD-10-CM

## 2013-09-26 NOTE — Progress Notes (Signed)
  Subjective:    Patient ID: Heather Armstrong, female    DOB: 06/30/1946, 67 y.o.   MRN: [id number]  HPI  She saw Dr. Susa Simmonds Surgeon to consider an implant. Dental imaging suggested calcium in the carotid arteries.  She has dyslipidemia and is on low-dose atorvastatin every other day. Her most recent lipids revealed a protective HDL of 58.1 and extremely good LDL of 75. Based on the advanced cholesterol testing your minimum LDL is less than 100 and ideal less than 70.  She has had mild hyperglycemia in the past. Her last A1c on record was 5.9% in 2010.    Review of Systems  A modified heart healthy diet is followed; exercise encompasses 20-25  minutes 3  times per week as  walking without symptoms.  Family history is ? for premature coronary disease in her mother. There is medication compliance with the statin. Low dose ASA taken Specifically denied are  chest pain, palpitations, dyspnea, or claudication.   She denies extremity weakness, numbness, or tingling except for localized numbness of L index finger     Objective:   Physical Exam Appears healthy and well-nourished & in no acute distress  No carotid bruits are present.No neck pain distention present at 10 - 15 degrees.   Heart rhythm and rate are normal with no significant murmurs or gallops.  Chest is clear with no increased work of breathing  There is no evidence of aortic aneurysm or renal artery bruits  Abdomen soft with no organomegaly or masses. No HJR  No clubbing, cyanosis or edema present.  Pedal pulses are intact   No ischemic skin changes are present .    Alert and oriented. Strength & tone . DTRs reflex decreased L knee. Light touch normal over index fingers.          Assessment & Plan:  #1 abnormal imaging carotid arteries  #2 paresthesia left index finger  #3 dyslipidemia  Plan: See orders

## 2013-09-26 NOTE — Addendum Note (Signed)
Addended byMarga Melnick F on: 09/26/2013 01:52 PM   Modules accepted: Level of Service

## 2013-09-26 NOTE — Patient Instructions (Signed)
Your next office appointment will be determined based upon review of your pending Carotid Doppler. Those instructions will be transmitted to you through My Chart .

## 2013-09-26 NOTE — Progress Notes (Signed)
Pre visit review using our clinic review tool, if applicable. No additional management support is needed unless otherwise documented below in the visit note. 

## 2013-09-26 NOTE — Telephone Encounter (Signed)
Atorvastatin refilled per protocol 

## 2013-10-08 ENCOUNTER — Telehealth: Payer: Self-pay | Admitting: *Deleted

## 2013-10-08 ENCOUNTER — Ambulatory Visit (AMBULATORY_SURGERY_CENTER): Payer: Self-pay | Admitting: *Deleted

## 2013-10-08 ENCOUNTER — Encounter: Payer: Medicare Other | Admitting: Gastroenterology

## 2013-10-08 VITALS — Ht 64.0 in | Wt 188.0 lb

## 2013-10-08 DIAGNOSIS — Z1211 Encounter for screening for malignant neoplasm of colon: Secondary | ICD-10-CM

## 2013-10-08 MED ORDER — NA SULFATE-K SULFATE-MG SULF 17.5-3.13-1.6 GM/177ML PO SOLN: ORAL | Status: DC

## 2013-10-08 NOTE — Progress Notes (Signed)
Patient denies any allergies to eggs or soy. Patient denies any problems with anesthesia,"slow to wake up" per patient.

## 2013-10-08 NOTE — Telephone Encounter (Signed)
Patient is for recall colonoscopy on 10-22-13. She states she was unable to drink the prep. In 2007 and was not cleaned out. Gave patient Suprep because it was lower volume. Is it okay to give her Reglan 10 mg po before each prep dose? [employer]

## 2013-10-09 MED ORDER — METOCLOPRAMIDE HCL 10 MG PO TABS: 10.0000 mg | ORAL_TABLET | Freq: Two times a day (BID) | ORAL | Status: DC

## 2013-10-09 NOTE — Telephone Encounter (Signed)
ok 

## 2013-10-10 ENCOUNTER — Encounter: Payer: Self-pay | Admitting: Gastroenterology

## 2013-10-22 ENCOUNTER — Encounter: Payer: Medicare Other | Admitting: Gastroenterology

## 2013-11-19 ENCOUNTER — Encounter: Payer: Self-pay | Admitting: Gastroenterology

## 2013-11-19 ENCOUNTER — Ambulatory Visit (AMBULATORY_SURGERY_CENTER): Payer: Medicare Other | Admitting: Gastroenterology

## 2013-11-19 VITALS — BP 150/76 | HR 77 | Temp 97.4°F | Resp 18 | Ht 64.0 in | Wt 188.0 lb

## 2013-11-19 DIAGNOSIS — Z1211 Encounter for screening for malignant neoplasm of colon: Secondary | ICD-10-CM

## 2013-11-19 DIAGNOSIS — K5730 Diverticulosis of large intestine without perforation or abscess without bleeding: Secondary | ICD-10-CM

## 2013-11-19 MED ORDER — SODIUM CHLORIDE 0.9 % IV SOLN: 500.0000 mL | INTRAVENOUS | Status: DC

## 2013-11-19 NOTE — Patient Instructions (Signed)
YOU HAD AN ENDOSCOPIC PROCEDURE TODAY AT THE [facility]: Refer to the procedure report that was given to you for any specific questions about what was found during the examination.  If the procedure report does not answer your questions, please call your gastroenterologist to clarify.  If you requested that your care partner not be given the details of your procedure findings, then the procedure report has been included in a sealed envelope for you to review at your convenience later.  YOU SHOULD EXPECT: Some feelings of bloating in the abdomen. Passage of more gas than usual.  Walking can help get rid of the air that was put into your GI tract during the procedure and reduce the bloating. If you had a lower endoscopy (such as a colonoscopy or flexible sigmoidoscopy) you may notice spotting of blood in your stool or on the toilet paper. If you underwent a bowel prep for your procedure, then you may not have a normal bowel movement for a few days.  DIET: Your first meal following the procedure should be a light meal and then it is ok to progress to your normal diet.  A half-sandwich or bowl of soup is an example of a good first meal.  Heavy or fried foods are harder to digest and may make you feel nauseous or bloated.  Likewise meals heavy in dairy and vegetables can cause extra gas to form and this can also increase the bloating.  Drink plenty of fluids but you should avoid alcoholic beverages for 24 hours.  ACTIVITY: Your care partner should take you home directly after the procedure.  You should plan to take it easy, moving slowly for the rest of the day.  You can resume normal activity the day after the procedure however you should NOT DRIVE or use heavy machinery for 24 hours (because of the sedation medicines used during the test).    SYMPTOMS TO REPORT IMMEDIATELY: A gastroenterologist can be reached at any hour.  During normal business hours, 8:30 AM to 5:00 PM Monday through Friday,  call [phone].  After hours and on weekends, please call the GI answering service at [phone] who will take a message and have the physician on call contact you.   Following lower endoscopy (colonoscopy or flexible sigmoidoscopy):  Excessive amounts of blood in the stool  Significant tenderness or worsening of abdominal pains  Swelling of the abdomen that is new, acute  Fever of 100F or higher    FOLLOW UP: If any biopsies were taken you will be contacted by phone or by letter within the next 1-3 weeks.  Call your gastroenterologist if you have not heard about the biopsies in 3 weeks.  Our staff will call the home number listed on your records the next business day following your procedure to check on you and address any questions or concerns that you may have at that time regarding the information given to you following your procedure. This is a courtesy call and so if there is no answer at the home number and we have not heard from you through the emergency physician on call, we will assume that you have returned to your regular daily activities without incident.  SIGNATURES/CONFIDENTIALITY: You and/or your care partner have signed paperwork which will be entered into your electronic medical record.  These signatures attest to the fact that that the information above on your After Visit Summary has been reviewed and is understood.  Full responsibility of the confidentiality   of this discharge information lies with you and/or your care-partner.  Diverticulosis, high fiber diet information given.  Sheet with fiber supplements given.  Metamucil recommended twice daily per Dr. Jarold MottoPatterson  Repeat colonoscopy in 10 years-2025

## 2013-11-19 NOTE — Progress Notes (Signed)
Lidocaine-40mg IV prior to Propofol InductionPropofol given over incremental dosages 

## 2013-11-19 NOTE — Op Note (Signed)
[facility] [address] [place], [id number]   COLONOSCOPY PROCEDURE REPORT  PATIENT: [name], Heather K.  MR#: [id number] BIRTHDATE: 04-03-1946 , 67  yrs. old GENDER: Female ENDOSCOPIST: Mardella Laymanavid R Patterson, MD, [facility] REFERRED BY: PROCEDURE DATE:  11/19/2013 PROCEDURE:   Colonoscopy, screening First Screening Colonoscopy - Avg.  risk and is 50 yrs.  old or older - No. ASA CLASS:   Class II INDICATIONS:Colorectal cancer screening. MEDICATIONS: propofol (Diprivan) 150mg  IV  DESCRIPTION OF PROCEDURE:   After the risks benefits and alternatives of the procedure were thoroughly explained, informed consent was obtained.  A digital rectal exam revealed no abnormalities of the rectum.   The LB [id number]  endoscope was introduced through the anus and advanced to the cecum, which was identified by both the appendix and ileocecal valve. No adverse events experienced.   Limited by a tortuous and redundant colon. The quality of the prep was good, using MoviPrep  The instrument was then slowly withdrawn as the colon was fully examined.      COLON FINDINGS: There was moderate diverticulosis noted in the descending colon and sigmoid colon with associated muscular hypertrophy.   The colon was otherwise normal.  There was no diverticulosis, inflammation, polyps or cancers unless previously stated.  Retroflexed views revealed no abnormalities. The time to cecum=7 minutes 0 seconds.  Withdrawal time=6 minutes 0 seconds. The scope was withdrawn and the procedure completed. COMPLICATIONS: There were no complications.  ENDOSCOPIC IMPRESSION: 1.   There was moderate diverticulosis noted in the descending colon and sigmoid colon 2.   The colon was otherwise normal ...very lax and floppy colon noted.  RECOMMENDATIONS: 1.  Continue current medications 2.  High fiber diet 3.  Continue current colorectal screening recommendations for "routine risk" patients with a repeat  colonoscopy in 10 years.   eSigned:  Mardella Laymanavid R Patterson, MD, [facility] 11/19/2013 9:29 AM   cc:   PATIENT NAME:  [name], Heather K. MR#: [id number]

## 2013-11-20 ENCOUNTER — Telehealth: Payer: Self-pay | Admitting: *Deleted

## 2013-11-20 NOTE — Telephone Encounter (Signed)
  Follow up Call-  Call back number 11/19/2013  Post procedure Call Back phone  # [phone]  Permission to leave phone message Yes     Patient questions:  Do you have a fever, pain , or abdominal swelling? no Pain Score  0 *  Have you tolerated food without any problems? yes  Have you been able to return to your normal activities? yes  Do you have any questions about your discharge instructions: Diet   no Medications  no Follow up visit  no  Do you have questions or concerns about your Care? no  Actions: * If pain score is 4 or above: No action needed, pain <4.

## 2014-03-31 ENCOUNTER — Other Ambulatory Visit: Payer: Self-pay

## 2014-03-31 MED ORDER — ATORVASTATIN CALCIUM 20 MG PO TABS: ORAL_TABLET | ORAL | Status: DC

## 2014-07-15 ENCOUNTER — Encounter: Payer: Self-pay | Admitting: Gastroenterology

## 2014-08-10 ENCOUNTER — Other Ambulatory Visit: Payer: Self-pay | Admitting: Obstetrics and Gynecology

## 2014-08-11 LAB — CYTOLOGY - PAP

## 2014-09-30 ENCOUNTER — Telehealth: Payer: Self-pay | Admitting: *Deleted

## 2014-09-30 NOTE — Telephone Encounter (Signed)
[facility]. JG//CMA

## 2016-12-14 ENCOUNTER — Other Ambulatory Visit: Payer: Self-pay | Admitting: Obstetrics and Gynecology

## 2016-12-14 DIAGNOSIS — R928 Other abnormal and inconclusive findings on diagnostic imaging of breast: Secondary | ICD-10-CM

## 2016-12-15 ENCOUNTER — Ambulatory Visit
Admission: RE | Admit: 2016-12-15 | Discharge: 2016-12-15 | Disposition: A | Discharge status: Home / Self Care | Payer: Medicare HMO | Source: Ambulatory Visit | Attending: Obstetrics and Gynecology | Admitting: Obstetrics and Gynecology

## 2016-12-15 DIAGNOSIS — R928 Other abnormal and inconclusive findings on diagnostic imaging of breast: Secondary | ICD-10-CM

## 2016-12-18 ENCOUNTER — Other Ambulatory Visit: Payer: Self-pay

## 2018-01-30 ENCOUNTER — Encounter (HOSPITAL_COMMUNITY): Payer: Self-pay | Admitting: Physician Assistant

## 2018-01-30 DIAGNOSIS — M1711 Unilateral primary osteoarthritis, right knee: Secondary | ICD-10-CM | POA: Diagnosis present

## 2018-01-30 NOTE — H&P (Signed)
TOTAL KNEE ADMISSION H&P  Patient is being admitted for right total knee arthroplasty.  Subjective:  Chief Complaint:right knee pain.  HPI: Heather Armstrong, 72 y.o. female, has a history of pain and functional disability in the right knee due to arthritis and has failed non-surgical conservative treatments for greater than 12 weeks to includeNSAID's and/or analgesics, corticosteriod injections, viscosupplementation injections, flexibility and strengthening excercises, supervised PT with diminished ADL's post treatment, weight reduction as appropriate and activity modification.  Onset of symptoms was abrupt, starting 1 years ago with rapidlly worsening course since that time. The patient noted no past surgery on the right knee(s).  Patient currently rates pain in the right knee(s) at 10 out of 10 with activity. Patient has night pain, worsening of pain with activity and weight bearing, pain that interferes with activities of daily living, crepitus and joint swelling.  Patient has evidence of subchondral sclerosis, periarticular osteophytes and joint space narrowing by imaging studies.  There is no active infection.  Patient Active Problem List   Diagnosis Date Noted  . Primary localized osteoarthritis of right knee   . OSTEOARTHRITIS 07/20/2009  . Vitamin D deficiency 03/30/2009  . Hyperlipidemia 07/04/2007   Past Medical History:  Diagnosis Date  . 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 Hyperlipidemia   . Osteoarthritis   . Primary localized osteoarthritis of right knee   . Vitamin D deficiency    Dr Vincente Poli    Past Surgical History:  Procedure Laterality Date  . CHOLECYSTECTOMY     Dr Jamey Ripa  . COLONOSCOPY  2007   Proctitis ; Dr Jarold Motto  . endometrial polypectomy  11/2011    Dr Vincente Poli; HRT D/Ced  . EYE SURGERY  09/2012   for ptosis affecting FOV  . KNEE ARTHROSCOPY  09/2009   Dr Thurston Hole; L knee  . TONSILLECTOMY    . TOTAL HIP ARTHROPLASTY  07/05/2010   Dr Thurston Hole; R. 2 u  pc    No current facility-administered medications for this encounter.    Current Outpatient Medications  Medication Sig Dispense Refill Last Dose  . aspirin EC 81 MG tablet Take 81 mg by mouth daily.   01/30/2018 at Unknown time  . atorvastatin (LIPITOR) 20 MG tablet TAKE 1 TABLET BY MOUTH DAILY (Patient taking differently: Take 20 mg by mouth every other day. ) 90 tablet 1 01/30/2018 at Unknown time  . celecoxib (CELEBREX) 200 MG capsule Take 200 mg by mouth daily.   01/30/2018 at Unknown time  . Cholecalciferol (VITAMIN D) 2000 units CAPS Take 4,000 Units by mouth daily.    01/30/2018 at Unknown time  . cloNIDine (CATAPRES) 0.1 MG tablet Take 0.1 mg by mouth daily.   01/30/2018 at Unknown time  . magnesium oxide (MAG-OX) 400 MG tablet Take 400 mg by mouth daily.   01/30/2018 at Unknown time  . Multiple Vitamins-Minerals (MULTIVITAMIN PO) Take 1 tablet by mouth daily.   01/30/2018 at Unknown time  . metoCLOPramide (REGLAN) 10 MG tablet Take 1 tablet (10 mg total) by mouth 2 (two) times daily. (Patient not taking: Reported on 01/22/2018) 2 tablet 0 Not Taking at Unknown time   Allergies  Allergen Reactions  . Penicillins Other (See Comments)    Angioedema Because of a history of documented adverse serious drug reaction;Medi Alert bracelet  is recommended  . Tramadol Hcl Other (See Comments)    REACTION: dizzy and uneasy feeling    Social History   Tobacco Use  . Smoking status: Never  Smoker  . Smokeless tobacco: Never Used  Substance Use Topics  . Alcohol use: No    Family History  Problem Relation Age of Onset  . Hypertension Mother   . Heart attack Mother 2461       died in her sleep  . Lung cancer Father        smoker  . Stroke Paternal Aunt        in 5280s  . Lung cancer Paternal Uncle        smoker  . Stroke Maternal Grandmother        in 7470s  . Diabetes Neg Hx   . Colon cancer Neg Hx      ROS  Objective:  Physical Exam  Vital signs in last 24 hours: Temp:  [97.5 F  (36.4 C)] 97.5 F (36.4 C) (03/27 1500) Pulse Rate:  [83] 83 (03/27 1500) BP: (180)/(72) 180/72 (03/27 1500) SpO2:  [97 %] 97 % (03/27 1500) Weight:  [86.7 kg (191 lb 3.2 oz)] 86.7 kg (191 lb 3.2 oz) (03/27 1500)  Labs:   Estimated body mass index is 33.87 kg/m as calculated from the following:   Height as of this encounter: 5\' 3"  (1.6 m).   Weight as of this encounter: 86.7 kg (191 lb 3.2 oz).   Imaging Review Plain radiographs demonstrate severe degenerative joint disease of the right knee(s). The overall alignment ismild varus. The bone quality appears to be good for age and reported activity level.   Preoperative templating of the joint replacement has been completed, documented, and submitted to the Operating Room personnel in order to optimize intra-operative equipment management.    Patient's anticipated LOS is less than 2 midnights, meeting these requirements: - Younger than 265 - Lives within 1 hour of care - Has a competent adult at home to recover with post-op recover - NO history of  - Chronic pain requiring opiods  - Diabetes  - Coronary Artery Disease  - Heart failure  - Heart attack  - Stroke  - DVT/VTE  - Cardiac arrhythmia  - Respiratory Failure/COPD  - Renal failure  - Anemia  - Advanced Liver disease        Assessment/Plan:  End stage arthritis, right knee Principal Problem:   Primary localized osteoarthritis of right knee Active Problems:   Vitamin D deficiency   Hyperlipidemia   The patient history, physical examination, clinical judgment of the provider and imaging studies are consistent with end stage degenerative joint disease of the right knee(s) and total knee arthroplasty is deemed medically necessary. The treatment options including medical management, injection therapy arthroscopy and arthroplasty were discussed at length. The risks and benefits of total knee arthroplasty were presented and reviewed. The risks due to aseptic  loosening, infection, stiffness, patella tracking problems, thromboembolic complications and other imponderables were discussed. The patient acknowledged the explanation, agreed to proceed with the plan and consent was signed. Patient is being admitted for inpatient treatment for surgery, pain control, PT, OT, prophylactic antibiotics, VTE prophylaxis, progressive ambulation and ADL's and discharge planning. The patient is planning to be discharged home with home health services

## 2018-01-31 ENCOUNTER — Encounter (HOSPITAL_COMMUNITY)
Admission: RE | Admit: 2018-01-31 | Discharge: 2018-01-31 | Disposition: A | Discharge status: Home / Self Care | Payer: Medicare HMO | Source: Ambulatory Visit | Attending: Orthopedic Surgery | Admitting: Orthopedic Surgery

## 2018-01-31 ENCOUNTER — Other Ambulatory Visit (HOSPITAL_COMMUNITY): Payer: Self-pay | Admitting: *Deleted

## 2018-01-31 ENCOUNTER — Encounter (HOSPITAL_COMMUNITY): Payer: Self-pay

## 2018-01-31 ENCOUNTER — Other Ambulatory Visit: Payer: Self-pay

## 2018-01-31 DIAGNOSIS — Z01810 Encounter for preprocedural cardiovascular examination: Secondary | ICD-10-CM | POA: Diagnosis present

## 2018-01-31 DIAGNOSIS — Z01812 Encounter for preprocedural laboratory examination: Secondary | ICD-10-CM | POA: Diagnosis present

## 2018-01-31 HISTORY — DX: Pneumonia, unspecified organism: J18.9

## 2018-01-31 HISTORY — DX: Dizziness and giddiness: R42

## 2018-01-31 HISTORY — DX: Cramp and spasm: R25.2

## 2018-01-31 HISTORY — DX: Adverse effect of unspecified anesthetic, initial encounter: T41.45XA

## 2018-01-31 HISTORY — DX: Duodenal ulcer, unspecified as acute or chronic, without hemorrhage or perforation: K26.9

## 2018-01-31 HISTORY — DX: Other complications of anesthesia, initial encounter: T88.59XA

## 2018-01-31 LAB — CBC WITH DIFFERENTIAL/PLATELET
BASOS ABS: 0.1 10*3/uL (ref 0.0–0.1)
Basophils Relative: 1 %
Eosinophils Absolute: 0.3 10*3/uL (ref 0.0–0.7)
Eosinophils Relative: 4 %
HCT: 43.5 % (ref 36.0–46.0)
Hemoglobin: 14 g/dL (ref 12.0–15.0)
LYMPHS ABS: 2.9 10*3/uL (ref 0.7–4.0)
LYMPHS PCT: 36 %
MCH: 31.6 pg (ref 26.0–34.0)
MCHC: 32.2 g/dL (ref 30.0–36.0)
MCV: 98.2 fL (ref 78.0–100.0)
MONO ABS: 0.5 10*3/uL (ref 0.1–1.0)
Monocytes Relative: 6 %
NEUTROS ABS: 4.2 10*3/uL (ref 1.7–7.7)
Neutrophils Relative %: 53 %
Platelets: 301 10*3/uL (ref 150–400)
RBC: 4.43 MIL/uL (ref 3.87–5.11)
RDW: 12.5 % (ref 11.5–15.5)
WBC: 7.9 10*3/uL (ref 4.0–10.5)

## 2018-01-31 LAB — SURGICAL PCR SCREEN
MRSA, PCR: NEGATIVE
STAPHYLOCOCCUS AUREUS: NEGATIVE

## 2018-01-31 LAB — COMPREHENSIVE METABOLIC PANEL
ALT: 17 U/L (ref 14–54)
AST: 23 U/L (ref 15–41)
Albumin: 4.2 g/dL (ref 3.5–5.0)
Alkaline Phosphatase: 92 U/L (ref 38–126)
Anion gap: 11 (ref 5–15)
BILIRUBIN TOTAL: 0.9 mg/dL (ref 0.3–1.2)
BUN: 13 mg/dL (ref 6–20)
CO2: 26 mmol/L (ref 22–32)
CREATININE: 0.85 mg/dL (ref 0.44–1.00)
Calcium: 9.5 mg/dL (ref 8.9–10.3)
Chloride: 103 mmol/L (ref 101–111)
GFR calc Af Amer: >60 mL/min (ref >60)
Glucose, Bld: 102 mg/dL — ABNORMAL HIGH (ref 65–99)
POTASSIUM: 4.6 mmol/L (ref 3.5–5.1)
Sodium: 140 mmol/L (ref 135–145)
TOTAL PROTEIN: 7.4 g/dL (ref 6.5–8.1)

## 2018-01-31 LAB — PROTIME-INR
INR: 0.96
PROTHROMBIN TIME: 12.7 s (ref 11.4–15.2)

## 2018-01-31 LAB — APTT: APTT: 30 s (ref 24–36)

## 2018-01-31 NOTE — Pre-Procedure Instructions (Signed)
Stoney Bangmanda K Seifert  01/31/2018    Your procedure is scheduled on Monday, February 11, 2018 at 7:15 AM.   [facility] at 5:30 AM.   Call this number if you have problems the morning of surgery: [phone]   Questions prior to day of surgery, please call [phone] between 8 & 4 PM.   Remember:  Do not eat food or drink liquids after midnight Sunday, 02/10/18.  Take these medicines the morning of surgery with A SIP OF WATER: Clonidine (Catapres)  Stop Aspirin as instructed by surgeon. Stop Multivitamins and NSAIDS (Celebrex, Aleve, Ibuprofen, etc) 5-7 days prior to surgery.    Do not wear jewelry, make-up or nail polish.  Do not wear lotions, powders, perfumes or deodorant.  Do not shave 48 hours prior to surgery.    Do not bring valuables to the hospital.  [facility] is not responsible for any belongings or valuables.  Contacts, dentures or bridgework may not be worn into surgery.  Leave your suitcase in the car.  After surgery it may be brought to your room.  For patients admitted to the hospital, discharge time will be determined by your treatment team.  [facility] - Preparing for Surgery  Before surgery, you can play an important role.  Because skin is not sterile, your skin needs to be as free of germs as possible.  You can reduce the number of germs on you skin by washing with CHG (chlorahexidine gluconate) soap before surgery.  CHG is an antiseptic cleaner which kills germs and bonds with the skin to continue killing germs even after washing.  Please DO NOT use if you have an allergy to CHG or antibacterial soaps.  If your skin becomes reddened/irritated stop using the CHG and inform your nurse when you arrive at Short Stay.  Do not shave (including legs and underarms) for at least 48 hours prior to the first CHG shower.  You may shave your face.  Please follow these instructions carefully:   1.  Shower with CHG Soap the night  before surgery and the                    morning of Surgery.  2.  If you choose to wash your hair, wash your hair first as usual with your       normal shampoo.  3.  After you shampoo, rinse your hair and body thoroughly to remove the shampoo.  4.  Use CHG as you would any other liquid soap.  You can apply chg directly       to the skin and wash gently with scrungie or a clean washcloth.  5.  Apply the CHG Soap to your body ONLY FROM THE NECK DOWN.        Do not use on open wounds or open sores.  Avoid contact with your eyes, ears, mouth and genitals (private parts).  Wash genitals (private parts) with your normal soap.  6.  Wash thoroughly, paying special attention to the area where your surgery        will be performed.  7.  Thoroughly rinse your body with warm water from the neck down.  8.  DO NOT shower/wash with your normal soap after using and rinsing off       the CHG Soap.  9.  Pat yourself dry with a clean towel.            10 .  Wear clean pajamas.            11.  Place clean sheets on your bed the night of your first shower and do not        sleep with pets.  Day of Surgery  Shower as above. Do not apply any lotions/deodorants the morning of surgery.  Please wear clean clothes to the hospital.   Please read over the fact sheets that you were given.

## 2018-01-31 NOTE — Progress Notes (Signed)
Pt denies cardiac history, HTN or Diabetes. Pt states she takes Clonidine for hot flashes.

## 2018-02-01 LAB — URINE CULTURE

## 2018-02-08 MED ORDER — TRANEXAMIC ACID 1000 MG/10ML IV SOLN
1000.0000 mg | INTRAVENOUS | Status: AC
  Administered 2018-02-11: 1000 mg via INTRAVENOUS
  Filled 2018-02-08: qty 1100

## 2018-02-10 NOTE — Anesthesia Preprocedure Evaluation (Addendum)
Anesthesia Evaluation  Patient identified by MRN, date of birth, ID band Patient awake    Reviewed: Allergy & Precautions, NPO status , Patient's Chart, lab work & pertinent test results  History of Anesthesia Complications (+) history of anesthetic complications  Airway Mallampati: II  TM Distance: <3 FB Neck ROM: Full    Dental  (+) Teeth Intact, Dental Advisory Given   Pulmonary neg pulmonary ROS,    breath sounds clear to auscultation       Cardiovascular negative cardio ROS   Rhythm:Regular Rate:Normal     Neuro/Psych negative neurological ROS     GI/Hepatic Neg liver ROS, PUD,   Endo/Other  negative endocrine ROS  Renal/GU negative Renal ROS     Musculoskeletal  (+) Arthritis , Osteoarthritis,    Abdominal   Peds  Hematology negative hematology ROS (+)   Anesthesia Other Findings   Reproductive/Obstetrics                           Anesthesia Physical Anesthesia Plan  ASA: I  Anesthesia Plan: Spinal   Post-op Pain Management:    Induction:   PONV Risk Score and Plan: 3 and Treatment may vary due to age or medical condition  Airway Management Planned: Natural Airway  Additional Equipment:   Intra-op Plan:   Post-operative Plan:   Informed Consent: I have reviewed the patients History and Physical, chart, labs and discussed the procedure including the risks, benefits and alternatives for the proposed anesthesia with the patient or authorized representative who has indicated his/her understanding and acceptance.     Plan Discussed with: CRNA  Anesthesia Plan Comments:         Anesthesia Quick Evaluation

## 2018-02-11 ENCOUNTER — Inpatient Hospital Stay (HOSPITAL_COMMUNITY)
Admission: RE | Admit: 2018-02-11 | Discharge: 2018-02-13 | DRG: 470 | Disposition: A | Discharge status: Home Health | Payer: Medicare HMO | Source: Ambulatory Visit | Attending: Orthopedic Surgery | Admitting: Orthopedic Surgery

## 2018-02-11 ENCOUNTER — Other Ambulatory Visit: Payer: Self-pay

## 2018-02-11 ENCOUNTER — Encounter (HOSPITAL_COMMUNITY)
Admission: RE | Disposition: A | Discharge status: Home Health | Payer: Self-pay | Source: Ambulatory Visit | Attending: Orthopedic Surgery

## 2018-02-11 ENCOUNTER — Inpatient Hospital Stay (HOSPITAL_COMMUNITY): Payer: Medicare HMO | Admitting: Anesthesiology

## 2018-02-11 ENCOUNTER — Encounter (HOSPITAL_COMMUNITY): Payer: Self-pay | Admitting: General Practice

## 2018-02-11 DIAGNOSIS — Z8711 Personal history of peptic ulcer disease: Secondary | ICD-10-CM

## 2018-02-11 DIAGNOSIS — M1711 Unilateral primary osteoarthritis, right knee: Secondary | ICD-10-CM | POA: Diagnosis present

## 2018-02-11 DIAGNOSIS — Z801 Family history of malignant neoplasm of trachea, bronchus and lung: Secondary | ICD-10-CM | POA: Diagnosis not present

## 2018-02-11 DIAGNOSIS — Z823 Family history of stroke: Secondary | ICD-10-CM | POA: Diagnosis not present

## 2018-02-11 DIAGNOSIS — D62 Acute posthemorrhagic anemia: Secondary | ICD-10-CM | POA: Diagnosis not present

## 2018-02-11 DIAGNOSIS — Z8249 Family history of ischemic heart disease and other diseases of the circulatory system: Secondary | ICD-10-CM

## 2018-02-11 DIAGNOSIS — E669 Obesity, unspecified: Secondary | ICD-10-CM | POA: Diagnosis present

## 2018-02-11 DIAGNOSIS — Z6833 Body mass index (BMI) 33.0-33.9, adult: Secondary | ICD-10-CM

## 2018-02-11 DIAGNOSIS — R112 Nausea with vomiting, unspecified: Secondary | ICD-10-CM | POA: Diagnosis not present

## 2018-02-11 DIAGNOSIS — E785 Hyperlipidemia, unspecified: Secondary | ICD-10-CM | POA: Diagnosis present

## 2018-02-11 DIAGNOSIS — E559 Vitamin D deficiency, unspecified: Secondary | ICD-10-CM | POA: Diagnosis present

## 2018-02-11 DIAGNOSIS — Z9889 Other specified postprocedural states: Secondary | ICD-10-CM | POA: Diagnosis not present

## 2018-02-11 DIAGNOSIS — Z880 Allergy status to penicillin: Secondary | ICD-10-CM | POA: Diagnosis not present

## 2018-02-11 DIAGNOSIS — Z7982 Long term (current) use of aspirin: Secondary | ICD-10-CM | POA: Diagnosis not present

## 2018-02-11 DIAGNOSIS — Z96641 Presence of right artificial hip joint: Secondary | ICD-10-CM | POA: Diagnosis present

## 2018-02-11 DIAGNOSIS — R42 Dizziness and giddiness: Secondary | ICD-10-CM | POA: Diagnosis not present

## 2018-02-11 HISTORY — DX: Nausea with vomiting, unspecified: R11.2

## 2018-02-11 HISTORY — DX: Other specified postprocedural states: Z98.890

## 2018-02-11 HISTORY — PX: TOTAL KNEE ARTHROPLASTY: SHX125

## 2018-02-11 HISTORY — DX: Unilateral primary osteoarthritis, right knee: M17.11

## 2018-02-11 SURGERY — ARTHROPLASTY, KNEE, TOTAL
Anesthesia: Spinal | Site: Knee | Laterality: Right

## 2018-02-11 MED ORDER — PROPOFOL 500 MG/50ML IV EMUL
INTRAVENOUS | Status: DC | PRN
  Administered 2018-02-11: 75 ug/kg/min via INTRAVENOUS

## 2018-02-11 MED ORDER — BUPIVACAINE-EPINEPHRINE (PF) 0.5% -1:200000 IJ SOLN
INTRAMUSCULAR | Status: AC
  Filled 2018-02-11: qty 30

## 2018-02-11 MED ORDER — ONDANSETRON HCL 4 MG PO TABS: 4.0000 mg | ORAL_TABLET | Freq: Four times a day (QID) | ORAL | Status: DC | PRN

## 2018-02-11 MED ORDER — VANCOMYCIN HCL IN DEXTROSE 1-5 GM/200ML-% IV SOLN
INTRAVENOUS | Status: AC
  Administered 2018-02-11: 1000 mg via INTRAVENOUS
  Filled 2018-02-11: qty 200

## 2018-02-11 MED ORDER — HYDROMORPHONE HCL 1 MG/ML IJ SOLN: 0.2500 mg | INTRAMUSCULAR | Status: DC | PRN

## 2018-02-11 MED ORDER — VANCOMYCIN HCL IN DEXTROSE 1-5 GM/200ML-% IV SOLN
1000.0000 mg | Freq: Two times a day (BID) | INTRAVENOUS | Status: AC
  Administered 2018-02-11: 1000 mg via INTRAVENOUS
  Filled 2018-02-11: qty 200

## 2018-02-11 MED ORDER — HYDROMORPHONE HCL 1 MG/ML IJ SOLN
0.5000 mg | INTRAMUSCULAR | Status: DC | PRN
  Administered 2018-02-11: 0.5 mg via INTRAVENOUS
  Filled 2018-02-11: qty 1

## 2018-02-11 MED ORDER — DEXAMETHASONE SODIUM PHOSPHATE 10 MG/ML IJ SOLN
INTRAMUSCULAR | Status: DC | PRN
  Administered 2018-02-11: 10 mg via INTRAVENOUS

## 2018-02-11 MED ORDER — DOCUSATE SODIUM 100 MG PO CAPS
100.0000 mg | ORAL_CAPSULE | Freq: Two times a day (BID) | ORAL | Status: DC
  Administered 2018-02-11 – 2018-02-13 (×4): 100 mg via ORAL
  Filled 2018-02-11 (×4): qty 1

## 2018-02-11 MED ORDER — BUPIVACAINE LIPOSOME 1.3 % IJ SUSP
20.0000 mL | Freq: Once | INTRAMUSCULAR | Status: DC
  Filled 2018-02-11: qty 20

## 2018-02-11 MED ORDER — LIDOCAINE 2% (20 MG/ML) 5 ML SYRINGE
INTRAMUSCULAR | Status: AC
  Filled 2018-02-11: qty 5

## 2018-02-11 MED ORDER — PROPOFOL 10 MG/ML IV BOLUS
INTRAVENOUS | Status: AC
  Filled 2018-02-11: qty 20

## 2018-02-11 MED ORDER — METOCLOPRAMIDE HCL 5 MG/ML IJ SOLN: 5.0000 mg | Freq: Three times a day (TID) | INTRAMUSCULAR | Status: DC | PRN

## 2018-02-11 MED ORDER — SODIUM CHLORIDE 0.9 % IJ SOLN
INTRAMUSCULAR | Status: DC | PRN
  Administered 2018-02-11: 50 mL

## 2018-02-11 MED ORDER — ONDANSETRON HCL 4 MG/2ML IJ SOLN
INTRAMUSCULAR | Status: AC
  Filled 2018-02-11: qty 2

## 2018-02-11 MED ORDER — ACETAMINOPHEN 500 MG PO TABS
1000.0000 mg | ORAL_TABLET | Freq: Four times a day (QID) | ORAL | Status: AC
  Administered 2018-02-11 – 2018-02-12 (×3): 1000 mg via ORAL
  Filled 2018-02-11 (×4): qty 2

## 2018-02-11 MED ORDER — DEXAMETHASONE SODIUM PHOSPHATE 10 MG/ML IJ SOLN
10.0000 mg | Freq: Three times a day (TID) | INTRAMUSCULAR | Status: AC
  Administered 2018-02-11 – 2018-02-12 (×4): 10 mg via INTRAVENOUS
  Filled 2018-02-11 (×4): qty 1

## 2018-02-11 MED ORDER — POTASSIUM CHLORIDE IN NACL 20-0.9 MEQ/L-% IV SOLN
INTRAVENOUS | Status: DC
  Administered 2018-02-11 (×2): via INTRAVENOUS
  Filled 2018-02-11 (×2): qty 1000

## 2018-02-11 MED ORDER — VANCOMYCIN HCL IN DEXTROSE 1-5 GM/200ML-% IV SOLN
1000.0000 mg | INTRAVENOUS | Status: AC
  Administered 2018-02-11: 1000 mg via INTRAVENOUS

## 2018-02-11 MED ORDER — ASPIRIN EC 325 MG PO TBEC
325.0000 mg | DELAYED_RELEASE_TABLET | Freq: Every day | ORAL | Status: DC
  Administered 2018-02-12 – 2018-02-13 (×2): 325 mg via ORAL
  Filled 2018-02-11 (×2): qty 1

## 2018-02-11 MED ORDER — ONDANSETRON HCL 4 MG/2ML IJ SOLN
4.0000 mg | Freq: Four times a day (QID) | INTRAMUSCULAR | Status: DC | PRN
  Administered 2018-02-11: 4 mg via INTRAVENOUS
  Filled 2018-02-11: qty 2

## 2018-02-11 MED ORDER — MENTHOL 3 MG MT LOZG: 1.0000 | LOZENGE | OROMUCOSAL | Status: DC | PRN

## 2018-02-11 MED ORDER — FENTANYL CITRATE (PF) 250 MCG/5ML IJ SOLN
INTRAMUSCULAR | Status: AC
Start: 2018-02-11 — End: 2018-02-11
  Filled 2018-02-11: qty 5

## 2018-02-11 MED ORDER — DEXAMETHASONE SODIUM PHOSPHATE 10 MG/ML IJ SOLN
INTRAMUSCULAR | Status: AC
  Filled 2018-02-11: qty 1

## 2018-02-11 MED ORDER — POVIDONE-IODINE 7.5 % EX SOLN
Freq: Once | CUTANEOUS | Status: DC
  Filled 2018-02-11: qty 118

## 2018-02-11 MED ORDER — CHLORHEXIDINE GLUCONATE 4 % EX LIQD: 60.0000 mL | Freq: Once | CUTANEOUS | Status: DC

## 2018-02-11 MED ORDER — OXYCODONE HCL 5 MG PO TABS
ORAL_TABLET | ORAL | Status: AC
  Filled 2018-02-11: qty 1

## 2018-02-11 MED ORDER — BUPIVACAINE LIPOSOME 1.3 % IJ SUSP
INTRAMUSCULAR | Status: DC | PRN
  Administered 2018-02-11: 20 mL

## 2018-02-11 MED ORDER — MIDAZOLAM HCL 2 MG/2ML IJ SOLN
INTRAMUSCULAR | Status: AC
  Filled 2018-02-11: qty 2

## 2018-02-11 MED ORDER — BUPIVACAINE-EPINEPHRINE (PF) 0.25% -1:200000 IJ SOLN
INTRAMUSCULAR | Status: AC
  Filled 2018-02-11: qty 30

## 2018-02-11 MED ORDER — BUPIVACAINE-EPINEPHRINE 0.25% -1:200000 IJ SOLN
INTRAMUSCULAR | Status: AC
  Filled 2018-02-11: qty 1

## 2018-02-11 MED ORDER — SODIUM CHLORIDE 0.9 % IR SOLN
Status: DC | PRN
  Administered 2018-02-11: 3000 mL

## 2018-02-11 MED ORDER — PHENYLEPHRINE 40 MCG/ML (10ML) SYRINGE FOR IV PUSH (FOR BLOOD PRESSURE SUPPORT)
PREFILLED_SYRINGE | INTRAVENOUS | Status: AC
  Filled 2018-02-11: qty 10

## 2018-02-11 MED ORDER — LACTATED RINGERS IV SOLN
INTRAVENOUS | Status: DC
  Administered 2018-02-11 (×2): via INTRAVENOUS

## 2018-02-11 MED ORDER — GABAPENTIN 300 MG PO CAPS
300.0000 mg | ORAL_CAPSULE | Freq: Every day | ORAL | Status: DC
  Administered 2018-02-11 – 2018-02-12 (×2): 300 mg via ORAL
  Filled 2018-02-11 (×2): qty 1

## 2018-02-11 MED ORDER — FENTANYL CITRATE (PF) 100 MCG/2ML IJ SOLN
INTRAMUSCULAR | Status: DC | PRN
  Administered 2018-02-11 (×2): 50 ug via INTRAVENOUS

## 2018-02-11 MED ORDER — ATORVASTATIN CALCIUM 20 MG PO TABS
20.0000 mg | ORAL_TABLET | ORAL | Status: DC
  Administered 2018-02-11: 20 mg via ORAL
  Filled 2018-02-11: qty 1

## 2018-02-11 MED ORDER — PHENOL 1.4 % MT LIQD: 1.0000 | OROMUCOSAL | Status: DC | PRN

## 2018-02-11 MED ORDER — ALUM & MAG HYDROXIDE-SIMETH 200-200-20 MG/5ML PO SUSP: 30.0000 mL | ORAL | Status: DC | PRN

## 2018-02-11 MED ORDER — ONDANSETRON HCL 4 MG/2ML IJ SOLN
INTRAMUSCULAR | Status: DC | PRN
  Administered 2018-02-11: 4 mg via INTRAVENOUS

## 2018-02-11 MED ORDER — POLYETHYLENE GLYCOL 3350 17 G PO PACK
17.0000 g | PACK | Freq: Two times a day (BID) | ORAL | Status: DC
  Administered 2018-02-11 – 2018-02-13 (×4): 17 g via ORAL
  Filled 2018-02-11 (×4): qty 1

## 2018-02-11 MED ORDER — ROPIVACAINE HCL 7.5 MG/ML IJ SOLN
INTRAMUSCULAR | Status: DC | PRN
  Administered 2018-02-11: 20 mL via PERINEURAL

## 2018-02-11 MED ORDER — PROPOFOL 10 MG/ML IV BOLUS
INTRAVENOUS | Status: DC | PRN
  Administered 2018-02-11: 20 mg via INTRAVENOUS

## 2018-02-11 MED ORDER — 0.9 % SODIUM CHLORIDE (POUR BTL) OPTIME
TOPICAL | Status: DC | PRN
  Administered 2018-02-11: 1000 mL

## 2018-02-11 MED ORDER — MAGNESIUM OXIDE 400 (241.3 MG) MG PO TABS
400.0000 mg | ORAL_TABLET | Freq: Every day | ORAL | Status: DC
  Administered 2018-02-12 – 2018-02-13 (×2): 400 mg via ORAL
  Filled 2018-02-11 (×2): qty 1

## 2018-02-11 MED ORDER — PHENYLEPHRINE HCL 10 MG/ML IJ SOLN
INTRAMUSCULAR | Status: DC | PRN
  Administered 2018-02-11: 80 ug via INTRAVENOUS
  Administered 2018-02-11: 40 ug via INTRAVENOUS
  Administered 2018-02-11 (×6): 80 ug via INTRAVENOUS
  Administered 2018-02-11: 40 ug via INTRAVENOUS

## 2018-02-11 MED ORDER — DIPHENHYDRAMINE HCL 12.5 MG/5ML PO ELIX: 12.5000 mg | ORAL_SOLUTION | ORAL | Status: DC | PRN

## 2018-02-11 MED ORDER — OXYCODONE HCL 5 MG/5ML PO SOLN: 5.0000 mg | Freq: Once | ORAL | Status: AC | PRN

## 2018-02-11 MED ORDER — HYDROMORPHONE HCL 1 MG/ML IJ SOLN
0.5000 mg | INTRAMUSCULAR | Status: DC | PRN
  Administered 2018-02-11 – 2018-02-13 (×4): 1 mg via INTRAVENOUS
  Filled 2018-02-11 (×4): qty 1

## 2018-02-11 MED ORDER — METOCLOPRAMIDE HCL 5 MG PO TABS: 5.0000 mg | ORAL_TABLET | Freq: Three times a day (TID) | ORAL | Status: DC | PRN

## 2018-02-11 MED ORDER — OXYCODONE HCL 5 MG PO TABS
5.0000 mg | ORAL_TABLET | Freq: Once | ORAL | Status: AC | PRN
  Administered 2018-02-11: 5 mg via ORAL

## 2018-02-11 MED ORDER — BUPIVACAINE-EPINEPHRINE 0.5% -1:200000 IJ SOLN
INTRAMUSCULAR | Status: DC | PRN
  Administered 2018-02-11: 50 mL

## 2018-02-11 MED ORDER — VITAMIN D 1000 UNITS PO TABS
4000.0000 [IU] | ORAL_TABLET | Freq: Every day | ORAL | Status: DC
  Administered 2018-02-12 – 2018-02-13 (×2): 4000 [IU] via ORAL
  Filled 2018-02-11 (×2): qty 4

## 2018-02-11 MED ORDER — MIDAZOLAM HCL 5 MG/5ML IJ SOLN
INTRAMUSCULAR | Status: DC | PRN
  Administered 2018-02-11 (×2): 1 mg via INTRAVENOUS

## 2018-02-11 MED ORDER — OXYCODONE HCL 5 MG PO TABS
5.0000 mg | ORAL_TABLET | ORAL | Status: DC | PRN
  Administered 2018-02-11: 10 mg via ORAL
  Administered 2018-02-11: 5 mg via ORAL
  Administered 2018-02-11 – 2018-02-13 (×9): 10 mg via ORAL
  Filled 2018-02-11 (×2): qty 2
  Filled 2018-02-11: qty 1
  Filled 2018-02-11 (×8): qty 2

## 2018-02-11 SURGICAL SUPPLY — 68 items
APL SKNCLS STERI-STRIP NONHPOA (GAUZE/BANDAGES/DRESSINGS) ×1
BANDAGE ESMARK 6X9 LF (GAUZE/BANDAGES/DRESSINGS) ×1 IMPLANT
BENZOIN TINCTURE PRP APPL 2/3 (GAUZE/BANDAGES/DRESSINGS) ×2 IMPLANT
BLADE SAGITTAL 25.0X1.19X90 (BLADE) ×2 IMPLANT
BLADE SAW SGTL 13X75X1.27 (BLADE) ×2 IMPLANT
BLADE SURG 10 STRL SS (BLADE) ×6 IMPLANT
BNDG CMPR 9X6 STRL LF SNTH (GAUZE/BANDAGES/DRESSINGS) ×1
BNDG CMPR MED 15X6 ELC VLCR LF (GAUZE/BANDAGES/DRESSINGS) ×1
BNDG ELASTIC 6X15 VLCR STRL LF (GAUZE/BANDAGES/DRESSINGS) ×2 IMPLANT
BNDG ESMARK 6X9 LF (GAUZE/BANDAGES/DRESSINGS) ×2
BOWL SMART MIX CTS (DISPOSABLE) ×2 IMPLANT
CAPT KNEE TOTAL 3 ATTUNE ×2 IMPLANT
CEMENT HV SMART SET (Cement) ×4 IMPLANT
CLSR STERI-STRIP ANTIMIC 1/2X4 (GAUZE/BANDAGES/DRESSINGS) ×2 IMPLANT
CONT SPEC 4OZ CLIKSEAL STRL BL (MISCELLANEOUS) ×2 IMPLANT
COVER SURGICAL LIGHT HANDLE (MISCELLANEOUS) ×2 IMPLANT
CUFF TOURNIQUET SINGLE 34IN LL (TOURNIQUET CUFF) ×2 IMPLANT
CUFF TOURNIQUET SINGLE 44IN (TOURNIQUET CUFF) IMPLANT
DECANTER SPIKE VIAL GLASS SM (MISCELLANEOUS) ×2 IMPLANT
DRAPE EXTREMITY T 121X128X90 (DRAPE) ×2 IMPLANT
DRAPE HALF SHEET 40X57 (DRAPES) ×4 IMPLANT
DRAPE INCISE IOBAN 66X45 STRL (DRAPES) ×2 IMPLANT
DRAPE ORTHO SPLIT 77X108 STRL (DRAPES) ×2
DRAPE SURG ORHT 6 SPLT 77X108 (DRAPES) ×1 IMPLANT
DRAPE U-SHAPE 47X51 STRL (DRAPES) ×2 IMPLANT
DRSG AQUACEL AG ADV 3.5X10 (GAUZE/BANDAGES/DRESSINGS) ×2 IMPLANT
DURAPREP 26ML APPLICATOR (WOUND CARE) ×4 IMPLANT
ELECT CAUTERY BLADE 6.4 (BLADE) ×2 IMPLANT
ELECT REM PT RETURN 9FT ADLT (ELECTROSURGICAL) ×2
ELECTRODE REM PT RTRN 9FT ADLT (ELECTROSURGICAL) ×1 IMPLANT
FACESHIELD WRAPAROUND (MASK) ×4 IMPLANT
GLOVE BIO SURGEON STRL SZ7 (GLOVE) ×2 IMPLANT
GLOVE BIOGEL PI IND STRL 7.0 (GLOVE) ×1 IMPLANT
GLOVE BIOGEL PI IND STRL 7.5 (GLOVE) ×1 IMPLANT
GLOVE BIOGEL PI INDICATOR 7.0 (GLOVE) ×1
GLOVE BIOGEL PI INDICATOR 7.5 (GLOVE) ×1
GLOVE SS BIOGEL STRL SZ 7.5 (GLOVE) ×1 IMPLANT
GLOVE SUPERSENSE BIOGEL SZ 7.5 (GLOVE) ×1
GOWN STRL REUS W/ TWL LRG LVL3 (GOWN DISPOSABLE) ×2 IMPLANT
GOWN STRL REUS W/ TWL XL LVL3 (GOWN DISPOSABLE) ×1 IMPLANT
GOWN STRL REUS W/TWL LRG LVL3 (GOWN DISPOSABLE) ×4
GOWN STRL REUS W/TWL XL LVL3 (GOWN DISPOSABLE) ×2
HANDPIECE INTERPULSE COAX TIP (DISPOSABLE) ×2
HOOD PEEL AWAY FACE SHEILD DIS (HOOD) ×4 IMPLANT
IMMOBILIZER KNEE 22 UNIV (SOFTGOODS) ×2 IMPLANT
KIT BASIN OR (CUSTOM PROCEDURE TRAY) ×2 IMPLANT
KIT TURNOVER KIT B (KITS) ×2 IMPLANT
MANIFOLD NEPTUNE II (INSTRUMENTS) ×2 IMPLANT
MARKER SKIN DUAL TIP RULER LAB (MISCELLANEOUS) ×2 IMPLANT
NEEDLE HYPO 22GX1.5 SAFETY (NEEDLE) ×4 IMPLANT
NS IRRIG 1000ML POUR BTL (IV SOLUTION) ×2 IMPLANT
PACK TOTAL JOINT (CUSTOM PROCEDURE TRAY) ×2 IMPLANT
PAD ARMBOARD 7.5X6 YLW CONV (MISCELLANEOUS) ×4 IMPLANT
PIN STEINMAN FIXATION KNEE (PIN) IMPLANT
SET HNDPC FAN SPRY TIP SCT (DISPOSABLE) ×1 IMPLANT
STRIP CLOSURE SKIN 1/2X4 (GAUZE/BANDAGES/DRESSINGS) ×2 IMPLANT
SUCTION FRAZIER HANDLE 10FR (MISCELLANEOUS) ×1
SUCTION TUBE FRAZIER 10FR DISP (MISCELLANEOUS) ×1 IMPLANT
SUT MNCRL AB 3-0 PS2 18 (SUTURE) ×2 IMPLANT
SUT VIC AB 0 CT1 27 (SUTURE) ×4
SUT VIC AB 0 CT1 27XBRD ANBCTR (SUTURE) ×2 IMPLANT
SUT VIC AB 1 CT1 27 (SUTURE) ×2
SUT VIC AB 1 CT1 27XBRD ANBCTR (SUTURE) ×1 IMPLANT
SUT VIC AB 2-0 CT1 27 (SUTURE) ×4
SUT VIC AB 2-0 CT1 TAPERPNT 27 (SUTURE) ×2 IMPLANT
SYR CONTROL 10ML LL (SYRINGE) ×4 IMPLANT
TOWEL OR 17X26 10 PK STRL BLUE (TOWEL DISPOSABLE) ×2 IMPLANT
WATER STERILE IRR 1000ML POUR (IV SOLUTION) IMPLANT

## 2018-02-11 NOTE — Interval H&P Note (Signed)
History and Physical Interval Note:  02/11/2018 7:00 AM  Heather Armstrong  has presented today for surgery, with the diagnosis of right knee djd  The various methods of treatment have been discussed with the patient and family. After consideration of risks, benefits and other options for treatment, the patient has consented to  Procedure(s): TOTAL KNEE ARTHROPLASTY (Right) as a surgical intervention .  The patient's history has been reviewed, patient examined, no change in status, stable for surgery.  I have reviewed the patient's chart and labs.  Questions were answered to the patient's satisfaction.     Nilda Simmerobert A Tion Tse

## 2018-02-11 NOTE — Transfer of Care (Signed)
Immediate Anesthesia Transfer of Care Note  Patient: Heather Armstrong  Procedure(s) Performed: TOTAL KNEE ARTHROPLASTY (Right Knee)  Patient Location: PACU  Anesthesia Type:MAC and Spinal  Level of Consciousness: awake, oriented and patient cooperative  Airway & Oxygen Therapy: Patient Spontanous Breathing and Patient connected to face mask oxygen  Post-op Assessment: Report given to RN and Post -op Vital signs reviewed and stable  Post vital signs: Reviewed  Last Vitals:  Vitals Value Taken Time  BP 158/124 02/11/2018  9:30 AM  Temp 36.7 C 02/11/2018  9:29 AM  Pulse 110 02/11/2018  9:38 AM  Resp 16 02/11/2018  9:38 AM  SpO2 99 % 02/11/2018  9:38 AM  Vitals shown include unvalidated device data.  Last Pain:  Vitals:   02/11/18 0929  TempSrc:   PainSc: 0-No pain      Patients Stated Pain Goal: 2 (11/91/47 8295)  Complications: No apparent anesthesia complications

## 2018-02-11 NOTE — Anesthesia Postprocedure Evaluation (Signed)
Anesthesia Post Note  Patient: Heather Armstrong  Procedure(s) Performed: TOTAL KNEE ARTHROPLASTY (Right Knee)     Patient location during evaluation: PACU Anesthesia Type: Spinal Level of consciousness: oriented and awake and alert Pain management: pain level controlled Vital Signs Assessment: post-procedure vital signs reviewed and stable Respiratory status: spontaneous breathing, respiratory function stable and patient connected to nasal cannula oxygen Cardiovascular status: blood pressure returned to baseline and stable Postop Assessment: no headache, no backache and no apparent nausea or vomiting Anesthetic complications: no    Last Vitals:  Vitals:   02/11/18 1015 02/11/18 1040  BP: (!) 111/58 135/65  Pulse: (!) 116 (!) 121  Resp: 15 16  Temp: 36.5 C   SpO2: 98% 93%    Last Pain:  Vitals:   02/11/18 1000  TempSrc:   PainSc: Asleep                 Dee Paden,JAMES TERRILL

## 2018-02-11 NOTE — Anesthesia Procedure Notes (Signed)
Procedure Name: MAC Date/Time: 02/11/2018 7:26 AM Performed by: Jenne Campus, CRNA Pre-anesthesia Checklist: Patient identified, Emergency Drugs available, Suction available and Patient being monitored Oxygen Delivery Method: Simple face mask

## 2018-02-11 NOTE — Op Note (Signed)
MRN:     [id number] DOB/AGE:    72/28/1947 / 72 y.o.       OPERATIVE REPORT    DATE OF PROCEDURE:  02/11/2018       PREOPERATIVE DIAGNOSIS:   right knee primary localized OA       Estimated body mass index is 33.83 kg/m as calculated from the following:   Height as of this encounter: 5\' 3"  (1.6 m).   Weight as of this encounter: 86.6 kg (191 lb).                                                        POSTOPERATIVE DIAGNOSIS:   same                                                                       PROCEDURE:  Procedure(s): TOTAL KNEE ARTHROPLASTY Using Depuy Attune RP implants #5 Femur, #5Tibia, 7mm  RP bearing, 32 Patella     SURGEON: Kyana Aicher A. Thurston HoleWainer, MD    ASSISTANT: Julien GirtKirstin Shepperson, PA-C, present and scrubbed throughout the case, critical for retraction, instrumentation, and closure.   ANESTHESIA: Spinal with Adductor Nerve Block   TOURNIQUET TIME: 60min    COMPLICATIONS:  None     SPECIMENS: None   INDICATIONS FOR PROCEDURE: The patient has djd  knee, varus deformities, XR shows bone on bone arthritis. Patient has failed all conservative measures including anti-inflammatory medicines, narcotics, attempts at exercise and weight loss, cortisone injections and viscosupplementation.  Risks and benefits of surgery have been discussed, questions answered.    DESCRIPTION OF PROCEDURE: The patient identified by armband, received right femoral nerve block and IV antibiotics, [facility] Patient taken to the operating room, appropriate anesthetic monitors were attached. Spinal anesthesia induced with the patient in supine position, Foley catheter was inserted. Tourniquet applied high to the operative thigh. Lateral post and foot positioner applied to the table, the lower extremity was then prepped and draped in usual sterile fashion from the ankle to the tourniquet. Time-out procedure was performed. The limb was wrapped with an Esmarch bandage and the  tourniquet inflated to 365 mmHg.   We began the operation by making the anterior midline incision starting at handbreadth above the patella going over the patella 1 cm medial to and 4 cm distal to the tibial tubercle. Small bleeders in the skin and the subcutaneous tissue identified and cauterized. Transverse retinaculum was incised and reflected medially and a medial parapatellar arthrotomy was accomplished. the patella was everted and theprepatellar fat pad resected. The superficial medial collateral ligament was then elevated from anterior to posterior along the proximal flare of the tibia and anterior half of the menisci resected. The knee was hyperflexed exposing bone on bone arthritis. Peripheral and notch osteophytes as well as the cruciate ligaments were then resected. We continued to work our way around posteriorly along the proximal tibia, and externally rotated the tibia subluxing it out from underneath the femur. A McHale retractor was placed through the notch and a lateral Hohmann retractor placed, and we  then drilled through the proximal tibia in line with the axis of the tibia followed by an intramedullary guide rod and 2-degree posterior slope cutting guide. The tibial cutting guide was pinned into place allowing resection of 4 mm of bone medially and about 6 mm of bone laterally because of her varus deformity.   Satisfied with the tibial resection, we then entered the distal femur 2 mm anterior to the PCL origin with the intramedullary guide rod and applied the distal femoral cutting guide set at 11mm, with 5 degrees of valgus. This was pinned along the epicondylar axis. At this point, the distal femoral cut was accomplished without difficulty. We then sized for a #5 femoral component and pinned the guide in 3 degrees of external rotation.The chamfer cutting guide was pinned into place. The anterior, posterior, and chamfer cuts were accomplished without difficulty followed by the  RP box cutting  guide and the box cut. We also removed posterior osteophytes from the posterior femoral condyles. At this time, the knee was brought into full extension. We checked our extension and flexion gaps and found them symmetric at 7mm. The patella thickness measured at 25 mm. We set the cutting guide at 15 and removed the posterior 9.5-10 mm of the patella sized for 32 button and drilled the lollipop. The knee was then once again hyperflexed exposing the proximal tibia. We sized for a #5 tibial base plate, applied the smokestack and the conical reamer followed by the the Delta fin keel punch. We then hammered into place the  RP trial femoral component, inserted a 7 trial bearing, trial patellar button, and took the knee through range of motion from 0-130 degrees. No thumb pressure was required for patellar tracking.   At this point, all trial components were removed, a double batch of DePuy HV cement  was mixed and applied to all bony metallic mating surfaces except for the posterior condyles of the femur itself .  And Ithe #5 femoral component was inserted  and removed excess cement in order, we hammered into place , the tibial component #5 with cement backing was then inserted with all excess cement removed,  , a 7mm  RP bearing was inserted, and the knee brought to full extension with compression. The 32 mm  patellar button was clamped into place, and excess cement removed. While the cement cured the wound was irrigated out with normal saline solution pulse lavage.. Ligament stability and patellar tracking were checked and found to be excellent..   The parapatellar arthrotomy was closed with  #1 Vicryl suture. The subcutaneous tissue with 0 and 2-0 undyed Vicryl suture, and 4-0 Monocryl.. A dressing of Aquaseal, 4 x 4, dressing sponges, Webril, and Ace wrap applied. Needle and sponge count were correct times 2.The patient awakened, extubated, and taken to recovery room without difficulty. Vascular status was normal,  pulses 2+ and symmetric.    Nilda Simmer 01/28/2018, 8:56 AM

## 2018-02-11 NOTE — Evaluation (Signed)
Physical Therapy Evaluation Patient Details Name: Heather Armstrong MRN: [id number] DOB: 08/10/1946 Today's Date: 02/11/2018   History of Present Illness  Pt is a 72 y/o female s/p elective R TKA. PMH includes THA.   Clinical Impression  Pt is s/p surgery above with deficits below. Pt limited by pain this session and requiring min to min guard A for mobility with RW. Did report pain began to get better as session progressed. Reviewed knee precautions and supine HEP. Will continue to follow acutely to maximize functional mobility independence and safety.     Follow Up Recommendations Follow surgeon's recommendation for DC plan and follow-up therapies;Supervision for mobility/OOB    Equipment Recommendations  None recommended by PT    Recommendations for Other Services OT consult     Precautions / Restrictions Precautions Precautions: Knee Precaution Booklet Issued: Yes (comment) Precaution Comments: Reviewed supine HEP and knee precautions with pt.  Restrictions Weight Bearing Restrictions: Yes RLE Weight Bearing: Weight bearing as tolerated      Mobility  Bed Mobility Overal bed mobility: Needs Assistance Bed Mobility: Supine to Sit     Supine to sit: Min assist     General bed mobility comments: Min A for RLE management and assist with trunk elevation. Increaed time required to perform.   Transfers Overall transfer level: Needs assistance Equipment used: Rolling walker (2 wheeled) Transfers: Sit to/from Stand Sit to Stand: Min assist         General transfer comment: Min A for lift assist and steadying assist. Verbal cues for safe hand placement.   Ambulation/Gait Ambulation/Gait assistance: Min guard Ambulation Distance (Feet): 5 Feet Assistive device: Rolling walker (2 wheeled) Gait Pattern/deviations: Step-to pattern;Decreased step length - right;Decreased step length - left;Decreased weight shift to right;Antalgic Gait velocity: Decreased  Gait velocity  interpretation: Below normal speed for age/gender General Gait Details: Slow, antalgic gait. Distance limited within the room secondary to pain. Verbal cues for sequencing using RW.   Stairs            Wheelchair Mobility    Modified Rankin (Stroke Patients Only)       Balance Overall balance assessment: Needs assistance Sitting-balance support: No upper extremity supported;Feet supported Sitting balance-Leahy Scale: Good     Standing balance support: Bilateral upper extremity supported;During functional activity Standing balance-Leahy Scale: Poor Standing balance comment: Reliant on BUE support.                              Pertinent Vitals/Pain Pain Assessment: 0-10 Pain Score: 7  Pain Location: R knee  Pain Descriptors / Indicators: Aching;Operative site guarding Pain Intervention(s): Limited activity within patient's tolerance;Monitored during session;Repositioned    Home Living Family/patient expects to be discharged to:: Private residence Living Arrangements: Spouse/significant other Available Help at Discharge: Family;Available 24 hours/day Type of Home: House Home Access: Stairs to enter Entrance Stairs-Rails: None Entrance Stairs-Number of Steps: 2 Home Layout: Two level;Able to live on main level with bedroom/bathroom Home Equipment: Dan HumphreysWalker - 2 wheels;Bedside commode;Tub bench;Other (comment)(CPM )      Prior Function Level of Independence: Independent with assistive device(s)         Comments: Used RW for ambulation      Hand Dominance   Dominant Hand: Right    Extremity/Trunk Assessment   Upper Extremity Assessment Upper Extremity Assessment: Defer to OT evaluation    Lower Extremity Assessment Lower Extremity Assessment: RLE deficits/detail RLE Deficits / Details: Sensory in  tact. Deficits consistent with post op pain and weakness. Able to perform ther ex below.     Cervical / Trunk Assessment Cervical / Trunk  Assessment: Normal  Communication   Communication: No difficulties  Cognition Arousal/Alertness: Awake/alert Behavior During Therapy: WFL for tasks assessed/performed Overall Cognitive Status: Within Functional Limits for tasks assessed                                        General Comments General comments (skin integrity, edema, etc.): Pt's daughters and husband present during session.     Exercises Total Joint Exercises Ankle Circles/Pumps: AROM;Both;20 reps Quad Sets: AROM;Right;10 reps Towel Squeeze: AROM;Both;10 reps Heel Slides: AROM;Right;10 reps   Assessment/Plan    PT Assessment Patient needs continued PT services  PT Problem List Decreased strength;Decreased range of motion;Decreased balance;Decreased mobility;Decreased knowledge of use of DME;Decreased knowledge of precautions;Pain       PT Treatment Interventions DME instruction;Gait training;Stair training;Therapeutic activities;Therapeutic exercise;Functional mobility training;Neuromuscular re-education;Balance training;Patient/family education    PT Goals (Current goals can be found in the Care Plan section)  Acute Rehab PT Goals Patient Stated Goal: to go home  PT Goal Formulation: With patient Time For Goal Achievement: 02/25/18 Potential to Achieve Goals: Good    Frequency 7X/week   Barriers to discharge        Co-evaluation               AM-PAC PT "6 Clicks" Daily Activity  Outcome Measure Difficulty turning over in bed (including adjusting bedclothes, sheets and blankets)?: A Little Difficulty moving from lying on back to sitting on the side of the bed? : Unable Difficulty sitting down on and standing up from a chair with arms (e.g., wheelchair, bedside commode, etc,.)?: Unable Help needed moving to and from a bed to chair (including a wheelchair)?: A Little Help needed walking in hospital room?: A Little Help needed climbing 3-5 steps with a railing? : A Lot 6 Click  Score: 13    End of Session Equipment Utilized During Treatment: Gait belt;Right knee immobilizer Activity Tolerance: Patient limited by pain Patient left: in chair;with call bell/phone within reach Nurse Communication: Mobility status PT Visit Diagnosis: Other abnormalities of gait and mobility (R26.89);Pain Pain - Right/Left: Right Pain - part of body: Knee    Time: 1610-9604 PT Time Calculation (min) (ACUTE ONLY): 39 min   Charges:   PT Evaluation $PT Eval Low Complexity: 1 Low PT Treatments $Therapeutic Activity: 23-37 mins   PT G Codes:        Gladys Damme, PT, DPT  Acute Rehabilitation Services  Pager: [phone]   Lehman Prom 02/11/2018, 6:34 PM

## 2018-02-11 NOTE — Progress Notes (Signed)
Orthopedic Tech Progress Note Patient Details:  Stoney Bangmanda K Mickelson 10/03/1946 [id number]  CPM Right Knee CPM Right Knee: On Right Knee Flexion (Degrees): 90 Right Knee Extension (Degrees): 0 Additional Comments: trapeze bar patient helper  Post Interventions Patient Tolerated: Well Instructions Provided: Care of device  Nikki DomCrawford, Esteen Delpriore 02/11/2018, 10:00 AM Viewed order from doctor's order list

## 2018-02-11 NOTE — Anesthesia Procedure Notes (Addendum)
Anesthesia Regional Block: Adductor canal block   Pre-Anesthetic Checklist: ,, timeout performed, Correct Patient, Correct Site, Correct Laterality, Correct Procedure, Correct Position, site marked, Risks and benefits discussed,  Surgical consent,  Pre-op evaluation,  At surgeon's request and post-op pain management  Laterality: Right and Lower  Prep: chloraprep       Needles:   Needle Type: Echogenic Stimulator Needle     Needle Length: 9cm  Needle Gauge: 21   Needle insertion depth: 5 cm   Additional Needles:   Procedures:,,,, ultrasound used (permanent image in chart),,,,  Narrative:  Start time: 02/11/2018 6:50 AM End time: 02/11/2018 7:05 AM Injection made incrementally with aspirations every 5 mL.  Performed by: Personally  Anesthesiologist: Sharee HolsterMassagee, Fabion Gatson, MD

## 2018-02-12 ENCOUNTER — Encounter (HOSPITAL_COMMUNITY): Payer: Self-pay | Admitting: Physician Assistant

## 2018-02-12 DIAGNOSIS — Z9889 Other specified postprocedural states: Secondary | ICD-10-CM

## 2018-02-12 DIAGNOSIS — R112 Nausea with vomiting, unspecified: Secondary | ICD-10-CM

## 2018-02-12 DIAGNOSIS — R42 Dizziness and giddiness: Secondary | ICD-10-CM

## 2018-02-12 HISTORY — DX: Nausea with vomiting, unspecified: R11.2

## 2018-02-12 LAB — CBC
HCT: 35.7 % — ABNORMAL LOW (ref 36.0–46.0)
Hemoglobin: 11.3 g/dL — ABNORMAL LOW (ref 12.0–15.0)
MCH: 31 pg (ref 26.0–34.0)
MCHC: 31.7 g/dL (ref 30.0–36.0)
MCV: 98.1 fL (ref 78.0–100.0)
PLATELETS: 277 10*3/uL (ref 150–400)
RBC: 3.64 MIL/uL — ABNORMAL LOW (ref 3.87–5.11)
RDW: 12.8 % (ref 11.5–15.5)
WBC: 13.1 10*3/uL — ABNORMAL HIGH (ref 4.0–10.5)

## 2018-02-12 LAB — BASIC METABOLIC PANEL
Anion gap: 8 (ref 5–15)
BUN: 10 mg/dL (ref 6–20)
CALCIUM: 8.7 mg/dL — AB (ref 8.9–10.3)
CO2: 23 mmol/L (ref 22–32)
CREATININE: 0.79 mg/dL (ref 0.44–1.00)
Chloride: 107 mmol/L (ref 101–111)
GFR calc Af Amer: >60 mL/min (ref >60)
Glucose, Bld: 198 mg/dL — ABNORMAL HIGH (ref 65–99)
Potassium: 4.6 mmol/L (ref 3.5–5.1)
SODIUM: 138 mmol/L (ref 135–145)

## 2018-02-12 LAB — HEMOGLOBIN A1C
HEMOGLOBIN A1C: 6.1 % — AB (ref 4.8–5.6)
Mean Plasma Glucose: 128.37 mg/dL

## 2018-02-12 MED ORDER — SODIUM CHLORIDE 0.9 % IV BOLUS
500.0000 mL | Freq: Once | INTRAVENOUS | Status: AC
  Administered 2018-02-12: 500 mL via INTRAVENOUS

## 2018-02-12 MED ORDER — METOCLOPRAMIDE HCL 5 MG PO TABS
5.0000 mg | ORAL_TABLET | Freq: Three times a day (TID) | ORAL | Status: DC
  Administered 2018-02-12 (×2): 5 mg via ORAL
  Administered 2018-02-12: 10 mg via ORAL
  Administered 2018-02-13: 5 mg via ORAL
  Filled 2018-02-12 (×2): qty 1
  Filled 2018-02-12: qty 2
  Filled 2018-02-12: qty 1

## 2018-02-12 MED ORDER — METOCLOPRAMIDE HCL 5 MG/ML IJ SOLN: 5.0000 mg | Freq: Three times a day (TID) | INTRAMUSCULAR | Status: DC

## 2018-02-12 NOTE — Evaluation (Signed)
Occupational Therapy Evaluation Patient Details Name: Heather Armstrong MRN: [id number] DOB: [birth year] Today's Date: 02/12/2018    History of Present Illness Pt is a 72 y/o female s/p elective R TKA. PMH includes THA.    Clinical Impression   This 72 y/o F presents with the above. Pt was most recently using a RW for functional mobility PTA, independent with ADLs. Pt currently requiring minA for LB ADLs, completing room level functional mobility and toileting ADLs with overall minguard assist using RW. Pt will be returning home with 24hr family assist. Will benefit from continued acute OT services to maximize her safety and independence with ADLs and mobility prior to return home.     Follow Up Recommendations  Follow surgeon's recommendation for DC plan and follow-up therapies;Supervision/Assistance - 24 hour(24hr initially )    Equipment Recommendations  None recommended by OT;Other (comment)(Pt's DME needs are met )           Precautions / Restrictions Precautions Precautions: Knee Precaution Comments: Reviewed knee precautions with pt.  Restrictions Weight Bearing Restrictions: Yes RLE Weight Bearing: Weight bearing as tolerated      Mobility Bed Mobility Overal bed mobility: Needs Assistance Bed Mobility: Supine to Sit     Supine to sit: Min guard     General bed mobility comments: MinGuard for safety, no physical assist needed   Transfers Overall transfer level: Needs assistance Equipment used: Rolling walker (2 wheeled) Transfers: Sit to/from Stand Sit to Stand: Min guard         General transfer comment: stood from EOB and BSC, VCs for safe hand placement, MinGuard for safety    Balance Overall balance assessment: Needs assistance Sitting-balance support: No upper extremity supported;Feet supported Sitting balance-Leahy Scale: Good     Standing balance support: Bilateral upper extremity supported;During functional activity;No upper extremity  supported Standing balance-Leahy Scale: Fair Standing balance comment: able to maintain static standing without UE support, reliant on UE support during mobility                            ADL either performed or assessed with clinical judgement   ADL Overall ADL's : Needs assistance/impaired Eating/Feeding: Modified independent;Sitting   Grooming: Min guard;Standing;Oral care   Upper Body Bathing: Min guard;Sitting   Lower Body Bathing: Sit to/from stand;Minimal assistance   Upper Body Dressing : Sitting;Set up   Lower Body Dressing: Minimal assistance;Sit to/from stand Lower Body Dressing Details (indicate cue type and reason): educated on compensatory techniques for completing task  Toilet Transfer: Min guard;Ambulation;BSC;RW Toilet Transfer Details (indicate cue type and reason): BSC over toilet  Toileting- Clothing Manipulation and Hygiene: Min guard;Sit to/from stand Toileting - Clothing Manipulation Details (indicate cue type and reason): minguard while pt completes clothing management in standing; completing peri-care setaed on BSC     Functional mobility during ADLs: Min guard;Rolling walker                           Pertinent Vitals/Pain Pain Assessment: 0-10 Pain Score: 6  Pain Location: R knee  Pain Descriptors / Indicators: Aching;Operative site guarding Pain Intervention(s): Limited activity within patient's tolerance;Monitored during session     Hand Dominance Right   Extremity/Trunk Assessment Upper Extremity Assessment Upper Extremity Assessment: Overall WFL for tasks assessed   Lower Extremity Assessment Lower Extremity Assessment: Defer to PT evaluation   Cervical / Trunk Assessment Cervical / Trunk Assessment: Normal  Communication Communication Communication: No difficulties   Cognition Arousal/Alertness: Awake/alert Behavior During Therapy: WFL for tasks assessed/performed Overall Cognitive Status: Within Functional  Limits for tasks assessed                                                      Home Living Family/patient expects to be discharged to:: Private residence Living Arrangements: Spouse/significant other Available Help at Discharge: Family;Available 24 hours/day Type of Home: House Home Access: Stairs to enter [employer] of Steps: 2 Entrance Stairs-Rails: None Home Layout: Two level;Able to live on main level with bedroom/bathroom     Bathroom Shower/Tub: Tub/shower unit   Bathroom Toilet: Handicapped height     Home Equipment: Environmental consultant - 2 wheels;Bedside commode;Tub bench;Other (comment)(CPM)          Prior Functioning/Environment Level of Independence: Independent with assistive device(s)        Comments: Used RW for ambulation         OT Problem List: Decreased strength;Decreased activity tolerance;Decreased knowledge of use of DME or AE;Pain      OT Treatment/Interventions: Self-care/ADL training;DME and/or AE instruction;Therapeutic activities;Balance training;Therapeutic exercise;Patient/family education    OT Goals(Current goals can be found in the care plan section) Acute Rehab OT Goals Patient Stated Goal: to go home  OT Goal Formulation: With patient Time For Goal Achievement: 02/26/18 Potential to Achieve Goals: Good  OT Frequency: Min 2X/week                             AM-PAC PT "6 Clicks" Daily Activity     Outcome Measure Help from another person eating meals?: None Help from another person taking care of personal grooming?: None Help from another person toileting, which includes using toliet, bedpan, or urinal?: A Little Help from another person bathing (including washing, rinsing, drying)?: A Little Help from another person to put on and taking off regular upper body clothing?: None Help from another person to put on and taking off regular lower body clothing?: A Little 6 Click Score: 21   End of Session  Equipment Utilized During Treatment: Gait belt;Rolling walker Nurse Communication: Mobility status  Activity Tolerance: Patient tolerated treatment well Patient left: in chair;with call bell/phone within reach;with family/visitor present  OT Visit Diagnosis: Other abnormalities of gait and mobility (R26.89)                Time: 9924-2683 OT Time Calculation (min): 22 min Charges:  OT General Charges $OT Visit: 1 Visit OT Evaluation $OT Eval Low Complexity: 1 Low G-Codes:     Lou Cal, OT Pager [phone] 02/12/2018   Heather Armstrong 02/12/2018, 3:30 PM

## 2018-02-12 NOTE — Progress Notes (Signed)
Physical Therapy Treatment Patient Details Name: Heather Armstrong MRN: [id number] DOB: 04-Jun-1946 Today's Date: 02/12/2018    History of Present Illness Pt is a 72 y/o female s/p elective R TKA. PMH includes THA.     PT Comments    Patient is progressing toward PT goals and tolerated gait and stair training this session without increased pain or nausea/dizziness. Continue to progress as tolerated.    Follow Up Recommendations  Follow surgeon's recommendation for DC plan and follow-up therapies;Supervision for mobility/OOB     Equipment Recommendations  None recommended by PT    Recommendations for Other Services OT consult     Precautions / Restrictions Precautions Precautions: Knee Precaution Booklet Issued: Yes (comment) Precaution Comments: Reviewed knee precautions with pt.  Restrictions Weight Bearing Restrictions: Yes RLE Weight Bearing: Weight bearing as tolerated    Mobility  Bed Mobility Overal bed mobility: Modified Independent Bed Mobility: Supine to Sit     Supine to sit: Min guard     General bed mobility comments: increased time and effort; no physical assist needed  Transfers Overall transfer level: Needs assistance Equipment used: Rolling walker (2 wheeled) Transfers: Sit to/from Stand Sit to Stand: Min guard         General transfer comment: cues for safe hand placement  Ambulation/Gait Ambulation/Gait assistance: Supervision;Min guard Ambulation Distance (Feet): 250 Feet Assistive device: Rolling walker (2 wheeled) Gait Pattern/deviations: Step-through pattern;Decreased stance time - right;Decreased step length - left;Decreased stride length Gait velocity: Decreased    General Gait Details: cues for sequencing and step length symmetry; pt wtih improved step through pattern and smoother sequencing with use of AD with increased distance   Stairs Stairs: Yes   Stair Management: No rails;Step to pattern;Backwards;With walker Number of  Stairs: 2 General stair comments: cues for sequencing and technique; assist to stabilize RW; daughter present and actively participating  Wheelchair Mobility    Modified Rankin (Stroke Patients Only)       Balance Overall balance assessment: Needs assistance Sitting-balance support: No upper extremity supported;Feet supported Sitting balance-Leahy Scale: Good     Standing balance support: Bilateral upper extremity supported;During functional activity;No upper extremity supported Standing balance-Leahy Scale: Fair Standing balance comment: able to maintain static standing without UE support, reliant on UE support during mobility                             Cognition Arousal/Alertness: Awake/alert Behavior During Therapy: WFL for tasks assessed/performed Overall Cognitive Status: Within Functional Limits for tasks assessed                                        Exercises     General Comments        Pertinent Vitals/Pain Pain Assessment: Faces Pain Score: 6  Faces Pain Scale: Hurts little more Pain Location: R knee  Pain Descriptors / Indicators: Sore Pain Intervention(s): Monitored during session;Premedicated before session;Repositioned    Home Living Family/patient expects to be discharged to:: Private residence Living Arrangements: Spouse/significant other Available Help at Discharge: Family;Available 24 hours/day Type of Home: House Home Access: Stairs to enter Entrance Stairs-Rails: None Home Layout: Two level;Able to live on main level with bedroom/bathroom Home Equipment: Dan Humphreys - 2 wheels;Bedside commode;Tub bench;Other (comment)(CPM)      Prior Function Level of Independence: Independent with assistive device(s)      Comments:  Used RW for ambulation    PT Goals (current goals can now be found in the care plan section) Acute Rehab PT Goals Patient Stated Goal: to go home  PT Goal Formulation: With patient Time For Goal  Achievement: 02/25/18 Potential to Achieve Goals: Good Progress towards PT goals: Progressing toward goals    Frequency    7X/week      PT Plan Current plan remains appropriate    Co-evaluation              AM-PAC PT "6 Clicks" Daily Activity  Outcome Measure  Difficulty turning over in bed (including adjusting bedclothes, sheets and blankets)?: A Little Difficulty moving from lying on back to sitting on the side of the bed? : A Lot Difficulty sitting down on and standing up from a chair with arms (e.g., wheelchair, bedside commode, etc,.)?: Unable Help needed moving to and from a bed to chair (including a wheelchair)?: A Little Help needed walking in hospital room?: A Little Help needed climbing 3-5 steps with a railing? : A Little 6 Click Score: 15    End of Session Equipment Utilized During Treatment: Gait belt Activity Tolerance: Patient tolerated treatment well Patient left: with call bell/phone within reach;in bed;with family/visitor present;in CPM Nurse Communication: Mobility status PT Visit Diagnosis: Other abnormalities of gait and mobility (R26.89);Pain Pain - Right/Left: Right Pain - part of body: Knee     Time: 5621-3086 PT Time Calculation (min) (ACUTE ONLY): 26 min  Charges:  $Gait Training: 23-37 mins                     G Codes:       Erline Levine, PTA Pager: [phone]     Carolynne Edouard 02/12/2018, 4:20 PM

## 2018-02-12 NOTE — Progress Notes (Signed)
Physical Therapy Treatment Patient Details Name: Heather Armstrong K Kovich MRN: [id number] DOB: 01/21/1946 Today's Date: 02/12/2018    History of Present Illness Pt is a 72 y/o female s/p elective R TKA. PMH includes THA.     PT Comments    This session focused on LE therex and HEP review. Pt tolerated session well and AROM 0-95. Continue to progress as tolerated.    Follow Up Recommendations  Follow surgeon's recommendation for DC plan and follow-up therapies;Supervision for mobility/OOB     Equipment Recommendations  None recommended by PT    Recommendations for Other Services OT consult     Precautions / Restrictions Precautions Precautions: Knee Precaution Booklet Issued: Yes (comment) Precaution Comments: Reviewed knee precautions with pt.  Restrictions Weight Bearing Restrictions: Yes RLE Weight Bearing: Weight bearing as tolerated    Mobility  Bed Mobility Overal bed mobility: Needs Assistance Bed Mobility: Supine to Sit     Supine to sit: Min guard     General bed mobility comments: MinGuard for safety, no physical assist needed   Transfers Overall transfer level: Needs assistance Equipment used: Rolling walker (2 wheeled) Transfers: Sit to/from Stand Sit to Stand: Min guard         General transfer comment: stood from EOB and BSC, VCs for safe hand placement, MinGuard for safety  Ambulation/Gait                 Stairs            Wheelchair Mobility    Modified Rankin (Stroke Patients Only)       Balance Overall balance assessment: Needs assistance Sitting-balance support: No upper extremity supported;Feet supported Sitting balance-Leahy Scale: Good     Standing balance support: Bilateral upper extremity supported;During functional activity;No upper extremity supported Standing balance-Leahy Scale: Fair Standing balance comment: able to maintain static standing without UE support, reliant on UE support during mobility                              Cognition Arousal/Alertness: Awake/alert Behavior During Therapy: WFL for tasks assessed/performed Overall Cognitive Status: Within Functional Limits for tasks assessed                                        Exercises Total Joint Exercises Quad Sets: AROM;Right;10 reps Short Arc Quad: AROM;Right;10 reps Heel Slides: AROM;Right;10 reps Hip ABduction/ADduction: AROM;Right;10 reps Straight Leg Raises: AROM;Right;10 reps Long Arc Quad: AROM;Right;10 reps Knee Flexion: AROM;Right;10 reps Goniometric ROM: 0-95    General Comments        Pertinent Vitals/Pain Pain Assessment: Faces Pain Score: 6  Faces Pain Scale: Hurts little more Pain Location: R knee  Pain Descriptors / Indicators: Sore Pain Intervention(s): Monitored during session;Premedicated before session;Repositioned;Ice applied    Home Living Family/patient expects to be discharged to:: Private residence Living Arrangements: Spouse/significant other Available Help at Discharge: Family;Available 24 hours/day Type of Home: House Home Access: Stairs to enter Entrance Stairs-Rails: None Home Layout: Two level;Able to live on main level with bedroom/bathroom Home Equipment: Dan HumphreysWalker - 2 wheels;Bedside commode;Tub bench;Other (comment)(CPM)      Prior Function Level of Independence: Independent with assistive device(s)      Comments: Used RW for ambulation    PT Goals (current goals can now be found in the care plan section) Acute Rehab PT Goals Patient Stated Goal:  to go home  PT Goal Formulation: With patient Time For Goal Achievement: 02/25/18 Potential to Achieve Goals: Good Progress towards PT goals: Progressing toward goals    Frequency    7X/week      PT Plan Current plan remains appropriate    Co-evaluation              AM-PAC PT "6 Clicks" Daily Activity  Outcome Measure  Difficulty turning over in bed (including adjusting bedclothes, sheets and  blankets)?: A Little Difficulty moving from lying on back to sitting on the side of the bed? : Unable Difficulty sitting down on and standing up from a chair with arms (e.g., wheelchair, bedside commode, etc,.)?: Unable Help needed moving to and from a bed to chair (including a wheelchair)?: A Little Help needed walking in hospital room?: A Little Help needed climbing 3-5 steps with a railing? : A Lot 6 Click Score: 13    End of Session   Activity Tolerance: Patient tolerated treatment well Patient left: with call bell/phone within reach;in bed;with family/visitor present Nurse Communication: Mobility status PT Visit Diagnosis: Other abnormalities of gait and mobility (R26.89);Pain Pain - Right/Left: Right Pain - part of body: Knee     Time: 4098-1191 PT Time Calculation (min) (ACUTE ONLY): 27 min  Charges:  $Therapeutic Exercise: 23-37 mins                    G Codes:       Erline Levine, PTA Pager: [phone]     Carolynne Edouard 02/12/2018, 4:14 PM

## 2018-02-12 NOTE — Progress Notes (Signed)
Subjective: 1 Day Post-Op Procedure(s) (LRB): TOTAL KNEE ARTHROPLASTY (Right) Patient reports pain as 7 on 0-10 scale.    Objective: Vital signs in last 24 hours: Temp:  [97.7 F (36.5 C)-98.4 F (36.9 C)] 98 F (36.7 C) (04/09 0005) Pulse Rate:  [98-121] 106 (04/09 0005) Resp:  [13-17] 17 (04/09 0005) BP: (104-171)/(57-124) 143/87 (04/09 0005) SpO2:  [92 %-98 %] 93 % (04/09 0005)  Intake/Output from previous day: 04/08 0701 - 04/09 0700 In: 1950 [P.O.:300; I.V.:1650] Out: 1600 [Urine:1550; Blood:50] Intake/Output this shift: No intake/output data recorded.  Recent Labs    02/12/18 0523  HGB 11.3*   Recent Labs    02/12/18 0523  WBC 13.1*  RBC 3.64*  HCT 35.7*  PLT 277   Recent Labs    02/12/18 0523  NA 138  K 4.6  CL 107  CO2 23  BUN 10  CREATININE 0.79  GLUCOSE 198*  CALCIUM 8.7*   No results for input(s): LABPT, INR in the last 72 hours.  ABD soft Neurovascular intact Sensation intact distally Intact pulses distally Dorsiflexion/Plantar flexion intact Incision: dressing C/D/I  Anticipated LOS equal to or greater than 2 midnights due to - Age 72 and older with one or more of the following:  - Obesity  - Expected need for hospital services (PT, OT, Nursing) required for safe  discharge  - Anticipated need for postoperative skilled nursing care or inpatient rehab  - Active co-morbidities: Anemia and post operative nausea and vomiting with light headedness OR   - Unanticipated findings during/Post Surgery: post operative nausea and vomiting leading to lightheadedness  - Patient is a high risk of re-admission due to: None   Assessment/Plan: 1 Day Post-Op Procedure(s) (LRB): TOTAL KNEE ARTHROPLASTY (Right)  Principal Problem:   Primary localized osteoarthritis of right knee Active Problems:   Vitamin D deficiency   Hyperlipidemia   Post-operative nausea and vomiting   Light headedness Patient is progressing well.  She had difficulty with  postoperative nausea and vomiting yesterday and lightheadedness with getting up and ambulation this am.  I have ordered two fluid boluses.  One now and one at 2 pm.   Advance diet Up with therapy Plan for discharge tomorrow Discharge home with home health    Pascal LuxKirstin J Chesky Heyer 02/12/2018, 8:31 AM

## 2018-02-12 NOTE — Care Management Note (Signed)
Case Management Note  Patient Details  Name: Heather Armstrong MRN: [id number] Date of Birth: 06/18/1946  Subjective/Objective:  72 yr old female s/p right total knee arthroplasty.                  Action/Plan: Case manager spoke with patient and husband concerning discharge plan and DME. The agency that patient was preoperatively setup with doesn't accept her insurance, case manager offered Choice, [facility] to [facility]ia[facility]. Patient will have family assistance at discharge.    Expected Discharge Date:   02/13/18               Expected Discharge Plan:  Home w Home Health Services  In-House Referral:  NA  Discharge planning Services  CM Consult  Post Acute Care Choice:  Home Health, Durable Medical Equipment Choice offered to:  Patient, Spouse  DME Arranged:  3-N-1, CPM(has RW) DME Agency:  [employer]  HH Arranged:  PT HH Agency:  [facility]  Status of Service:  Completed, signed off  If discussed at Long Length of Stay Meetings, dates discussed:    Additional Comments:  Durenda GuthrieBrady, Vernestine Brodhead Naomi, RN 02/12/2018, 11:15 AM

## 2018-02-13 ENCOUNTER — Encounter (HOSPITAL_COMMUNITY): Payer: Self-pay | Admitting: Orthopedic Surgery

## 2018-02-13 LAB — CBC
HCT: 35.8 % — ABNORMAL LOW (ref 36.0–46.0)
Hemoglobin: 11.3 g/dL — ABNORMAL LOW (ref 12.0–15.0)
MCH: 31.2 pg (ref 26.0–34.0)
MCHC: 31.6 g/dL (ref 30.0–36.0)
MCV: 98.9 fL (ref 78.0–100.0)
PLATELETS: 307 10*3/uL (ref 150–400)
RBC: 3.62 MIL/uL — ABNORMAL LOW (ref 3.87–5.11)
RDW: 13.1 % (ref 11.5–15.5)
WBC: 15.1 10*3/uL — ABNORMAL HIGH (ref 4.0–10.5)

## 2018-02-13 LAB — BASIC METABOLIC PANEL
Anion gap: 10 (ref 5–15)
BUN: 12 mg/dL (ref 6–20)
CO2: 27 mmol/L (ref 22–32)
Calcium: 9 mg/dL (ref 8.9–10.3)
Chloride: 102 mmol/L (ref 101–111)
Creatinine, Ser: 0.72 mg/dL (ref 0.44–1.00)
GFR calc Af Amer: >60 mL/min (ref >60)
GFR calc non Af Amer: >60 mL/min (ref >60)
GLUCOSE: 135 mg/dL — AB (ref 65–99)
POTASSIUM: 5.1 mmol/L (ref 3.5–5.1)
Sodium: 139 mmol/L (ref 135–145)

## 2018-02-13 MED ORDER — GABAPENTIN 300 MG PO CAPS: 300.0000 mg | ORAL_CAPSULE | Freq: Every day | ORAL | 0 refills | Status: DC

## 2018-02-13 MED ORDER — CLONIDINE HCL 0.1 MG PO TABS
0.1000 mg | ORAL_TABLET | Freq: Every day | ORAL | Status: DC
  Administered 2018-02-13: 0.1 mg via ORAL
  Filled 2018-02-13: qty 1

## 2018-02-13 MED ORDER — OXYCODONE HCL 5 MG PO TABS: ORAL_TABLET | ORAL | 0 refills | Status: DC

## 2018-02-13 MED ORDER — POLYETHYLENE GLYCOL 3350 17 G PO PACK: PACK | ORAL | 0 refills | Status: AC

## 2018-02-13 MED ORDER — DOCUSATE SODIUM 100 MG PO CAPS: ORAL_CAPSULE | ORAL | 0 refills | Status: DC

## 2018-02-13 MED ORDER — ASPIRIN 325 MG PO TBEC: DELAYED_RELEASE_TABLET | ORAL | 0 refills | Status: DC

## 2018-02-13 NOTE — Discharge Summary (Signed)
Patient ID: Heather Armstrong MRN: [id number] DOB/AGE: 72/12/1945 72 y.o.  Admit date: 02/11/2018 Discharge date: 02/13/2018  Admission Diagnoses:  Principal Problem:   Primary localized osteoarthritis of right knee Active Problems:   Vitamin D deficiency   Hyperlipidemia   Post-operative nausea and vomiting   Light headedness   Discharge Diagnoses:  Same  Past Medical History:  Diagnosis Date  . Complication of anesthesia    "hard to wake up", (02/11/2018)  . Duodenal ulcer   . 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 Hyperlipidemia   . Leg cramps   . Osteoarthritis    "right hip and knee" (02/11/2018)  . Pneumonia    "once" (02/11/2018)  . Post-operative nausea and vomiting 02/12/2018  . Primary localized osteoarthritis of right knee   . Vertigo   . Vitamin D deficiency    Dr Vincente PoliGrewal    Surgeries: Procedure(s): TOTAL KNEE ARTHROPLASTY on 02/11/2018   Consultants:   Discharged Condition: Improved  Hospital Course: Heather Armstrong is an 72 y.o. female who was admitted 02/11/2018 for operative treatment ofPrimary localized osteoarthritis of right knee. Patient has severe unremitting pain that affects sleep, daily activities, and work/hobbies. After pre-op clearance the patient was taken to the operating room on 02/11/2018 and underwent  Procedure(s): TOTAL KNEE ARTHROPLASTY.    Patient was given perioperative antibiotics:  Anti-infectives (From admission, onward)   Start     Dose/Rate Route Frequency Ordered Stop   02/11/18 1800  vancomycin (VANCOCIN) IVPB 1000 mg/200 mL premix     1,000 mg 200 mL/hr over 60 Minutes Intravenous Every 12 hours 02/11/18 1053 02/11/18 1914   02/11/18 0552  vancomycin (VANCOCIN) IVPB 1000 mg/200 mL premix     1,000 mg 200 mL/hr over 60 Minutes Intravenous On call to O.R. 02/11/18 [id number] 02/11/18 0717       Patient was given sequential compression devices, early ambulation, and chemoprophylaxis to prevent DVT.  Patient benefited maximally from  hospital stay and there were no complications.    Recent vital signs:  Patient Vitals for the past 24 hrs:  BP Temp Temp src Pulse Resp SpO2  02/13/18 0453 137/83 97.9 F (36.6 C) Oral (!) 101 - 96 %  02/13/18 0250 135/87 - - - - -  02/13/18 0030 (!) 154/104 98.3 F (36.8 C) Oral (!) 116 - 94 %  02/12/18 2050 139/78 98.7 F (37.1 C) Oral (!) 106 - 96 %  02/12/18 1000 (!) 145/89 98.5 F (36.9 C) Oral 96 18 93 %     Recent laboratory studies:  Recent Labs    02/12/18 0523  WBC 13.1*  HGB 11.3*  HCT 35.7*  PLT 277  NA 138  K 4.6  CL 107  CO2 23  BUN 10  CREATININE 0.79  GLUCOSE 198*  CALCIUM 8.7*     Discharge Medications:   Allergies as of 02/13/2018      Reactions   Penicillins Other (See Comments)   Angioedema Because of a history of documented adverse serious drug reaction;Medi Alert bracelet  is recommended   Tramadol Hcl Itching   REACTION: dizzy and uneasy feeling   Tramadol Hcl Other (See Comments)   REACTION: dizzy and uneasy feeling      Medication List    STOP taking these medications   acetaminophen 500 MG tablet Commonly known as:  TYLENOL   celecoxib 200 MG capsule Commonly known as:  CELEBREX   metoCLOPramide 10 MG tablet Commonly known as:  REGLAN  TAKE these medications   aspirin 325 MG EC tablet 1 tab a day for the next 30 days to prevent blood clots What changed:    medication strength  how much to take  how to take this  when to take this  additional instructions   atorvastatin 20 MG tablet Commonly known as:  LIPITOR TAKE 1 TABLET BY MOUTH DAILY What changed:    how much to take  how to take this  when to take this  additional instructions   cloNIDine 0.1 MG tablet Commonly known as:  CATAPRES Take 0.1 mg by mouth daily.   docusate sodium 100 MG capsule Commonly known as:  COLACE 1 tab 2 times a day while on narcotics.  STOOL SOFTENER   gabapentin 300 MG capsule Commonly known as:  NEURONTIN Take 1  capsule (300 mg total) by mouth at bedtime.   magnesium oxide 400 MG tablet Commonly known as:  MAG-OX Take 400 mg by mouth daily.   MULTIVITAMIN PO Take 1 tablet by mouth daily.   oxyCODONE 5 MG immediate release tablet Commonly known as:  Oxy IR/ROXICODONE 2 pills po q 4 hrs prn pain.  Pt has been on this dosage of Oxy since 02/11/2018 in hospital & tolerated well.  This patient had a left total knee replacement on 02/11/2018   polyethylene glycol packet Commonly known as:  MIRALAX / GLYCOLAX 17grams in 6 oz of drink twice a day until bowel movement.  LAXITIVE.  Restart if two days since last bowel movement   Vitamin D 2000 units Caps Take 4,000 Units by mouth daily.            Discharge Care Instructions  (From admission, onward)        Start     Ordered   02/13/18 0000  Change dressing    Comments:  DO NOT REMOVE BANDAGE OVER SURGICAL INCISION.  WASH WHOLE LEG INCLUDING OVER THE WATERPROOF BANDAGE WITH SOAP AND WATER EVERY DAY.   02/13/18 0735      Diagnostic Studies: No results found.  Disposition: Discharge disposition: 01-Home or Self Care       Discharge Instructions    CPM   Complete by:  As directed    Continuous passive motion machine (CPM):      Use the CPM from 0 to 90 for 6 hours per day.       You may break it up into 2 or 3 sessions per day.      Use CPM for 2 weeks or until you are told to stop.   Call MD / Call 911   Complete by:  As directed    If you experience chest pain or shortness of breath, CALL 911 and be transported to the hospital emergency room.  If you develope a fever above 101 F, pus (white drainage) or increased drainage or redness at the wound, or calf pain, call your surgeon's office.   Change dressing   Complete by:  As directed    DO NOT REMOVE BANDAGE OVER SURGICAL INCISION.  WASH WHOLE LEG INCLUDING OVER THE WATERPROOF BANDAGE WITH SOAP AND WATER EVERY DAY.   Constipation Prevention   Complete by:  As directed    Drink  plenty of fluids.  Prune juice may be helpful.  You may use a stool softener, such as Colace (over the counter) 100 mg twice a day.  Use MiraLax (over the counter) for constipation as needed.   Diet - low sodium heart  healthy   Complete by:  As directed    Discharge instructions   Complete by:  As directed    INSTRUCTIONS AFTER JOINT REPLACEMENT   Remove items at home which could result in a fall. This includes throw rugs or furniture in walking pathways ICE to the affected joint every three hours while awake for 30 minutes at a time, for at least the first 3-5 days, and then as needed for pain and swelling.  Continue to use ice for pain and swelling. You may notice swelling that will progress down to the foot and ankle.  This is normal after surgery.  Elevate your leg when you are not up walking on it.   Continue to use the breathing machine you got in the hospital (incentive spirometer) which will help keep your temperature down.  It is common for your temperature to cycle up and down following surgery, especially at night when you are not up moving around and exerting yourself.  The breathing machine keeps your lungs expanded and your temperature down.   DIET:  As you were doing prior to hospitalization, we recommend a well-balanced diet.  DRESSING / WOUND CARE / SHOWERING  Keep the surgical dressing until follow up.  The dressing is water proof, so you can shower without any extra covering.  IF THE DRESSING FALLS OFF or the wound gets wet inside, change the dressing with sterile gauze.  Please use good hand washing techniques before changing the dressing.  Do not use any lotions or creams on the incision until instructed by your surgeon.    ACTIVITY  Increase activity slowly as tolerated, but follow the weight bearing instructions below.   No driving for 6 weeks or until further direction given by your physician.  You cannot drive while taking narcotics.  No lifting or carrying greater than  10 lbs. until further directed by your surgeon. Avoid periods of inactivity such as sitting longer than an hour when not asleep. This helps prevent blood clots.  You may return to work once you are authorized by your doctor.     WEIGHT BEARING   Weight bearing as tolerated with assist device (walker, cane, etc) as directed, use it as long as suggested by your surgeon or therapist, typically at least 2-3 weeks.   EXERCISES  Results after joint replacement surgery are often greatly improved when you follow the exercise, range of motion and muscle strengthening exercises prescribed by your doctor. Safety measures are also important to protect the joint from further injury. Any time any of these exercises cause you to have increased pain or swelling, decrease what you are doing until you are comfortable again and then slowly increase them. If you have problems or questions, call your caregiver or physical therapist for advice.   Rehabilitation is important following a joint replacement. After just a few days of immobilization, the muscles of the leg can become weakened and shrink (atrophy).  These exercises are designed to build up the tone and strength of the thigh and leg muscles and to improve motion. Often times heat used for twenty to thirty minutes before working out will loosen up your tissues and help with improving the range of motion but do not use heat for the first two weeks following surgery (sometimes heat can increase post-operative swelling).   These exercises can be done on a training (exercise) mat, on the floor, on a table or on a bed. Use whatever works the best and is most comfortable  for you.    Use music or television while you are exercising so that the exercises are a pleasant break in your day. This will make your life better with the exercises acting as a break in your routine that you can look forward to.   Perform all exercises about fifteen times, three times per day or as  directed.  You should exercise both the operative leg and the other leg as well.   Exercises include:  Quad Sets - Tighten up the muscle on the front of the thigh (Quad) and hold for 5-10 seconds.   Straight Leg Raises - With your knee straight (if you were given a brace, keep it on), lift the leg to 60 degrees, hold for 3 seconds, and slowly lower the leg.  Perform this exercise against resistance later as your leg gets stronger.  Leg Slides: Lying on your back, slowly slide your foot toward your buttocks, bending your knee up off the floor (only go as far as is comfortable). Then slowly slide your foot back down until your leg is flat on the floor again.  Angel Wings: Lying on your back spread your legs to the side as far apart as you can without causing discomfort.  Hamstring Strength:  Lying on your back, push your heel against the floor with your leg straight by tightening up the muscles of your buttocks.  Repeat, but this time bend your knee to a comfortable angle, and push your heel against the floor.  You may put a pillow under the heel to make it more comfortable if necessary.   A rehabilitation program following joint replacement surgery can speed recovery and prevent re-injury in the future due to weakened muscles. Contact your doctor or a physical therapist for more information on knee rehabilitation.    CONSTIPATION  Constipation is defined medically as fewer than three stools per week and severe constipation as less than one stool per week.  Even if you have a regular bowel pattern at home, your normal regimen is likely to be disrupted due to multiple reasons following surgery.  Combination of anesthesia, postoperative narcotics, change in appetite and fluid intake all can affect your bowels.   YOU MUST use at least one of the following options; they are listed in order of increasing strength to get the job done.  They are all available over the counter, and you may need to use some,  POSSIBLY even all of these options:    Drink plenty of fluids (prune juice may be helpful) and high fiber foods Colace 100 mg by mouth twice a day  Senokot for constipation as directed and as needed Dulcolax (bisacodyl), take with full glass of water  Miralax (polyethylene glycol) once or twice a day as needed.  If you have tried all these things and are unable to have a bowel movement in the first 3-4 days after surgery call either your surgeon or your primary doctor.    If you experience loose stools or diarrhea, hold the medications until you stool forms back up.  If your symptoms do not get better within 1 week or if they get worse, check with your doctor.  If you experience "the worst abdominal pain ever" or develop nausea or vomiting, please contact the office immediately for further recommendations for treatment.   ITCHING:  If you experience itching with your medications, try taking only a single pain pill, or even half a pain pill at a time.  You can also  use Benadryl over the counter for itching or also to help with sleep.   TED HOSE STOCKINGS:  Use stockings on both legs until for at least 2 weeks or as directed by physician office. They may be removed at night for sleeping.  MEDICATIONS:  See your medication summary on the "After Visit Summary" that nursing will review with you.  You may have some home medications which will be placed on hold until you complete the course of blood thinner medication.  It is important for you to complete the blood thinner medication as prescribed.  PRECAUTIONS:  If you experience chest pain or shortness of breath - call 911 immediately for transfer to the hospital emergency department.   If you develop a fever greater that 101 F, purulent drainage from wound, increased redness or drainage from wound, foul odor from the wound/dressing, or calf pain - CONTACT YOUR SURGEON.                                                   FOLLOW-UP APPOINTMENTS:  If  you do not already have a post-op appointment, please call the office for an appointment to be seen by your surgeon.  Guidelines for how soon to be seen are listed in your "After Visit Summary", but are typically between 1-4 weeks after surgery.  OTHER INSTRUCTIONS:   Knee Replacement:  Do not place pillow under knee, focus on keeping the knee straight while resting. CPM instructions: 0-90 degrees, 2 hours in the morning, 2 hours in the afternoon, and 2 hours in the evening. Place foam block, curve side up under heel at all times except when in CPM or when walking.  DO NOT modify, tear, cut, or change the foam block in any way.  MAKE SURE YOU:  Understand these instructions.  Get help right away if you are not doing well or get worse.    Thank you for letting us be a part of your medical care team.  It is a privilege we respect greatly.  We hope these instructions will help you stay on track for a fast and full recovery!   Do not put a pillow under the knee. Place it under the heel.   Complete by:  As directed    Place gray foam block, curve side up under heel at all times except when in CPM or when walking.  DO NOT modify, tear, cut, or change in any way the gray foam block.   Increase activity slowly as tolerated   Complete by:  As directed    Patient may shower   Complete by:  As directed    Aquacel dressing is water proof    Wash over it and the whole leg with soap and water at the end of your shower   TED hose   Complete by:  As directed    Use stockings (TED hose) for 2 weeks on both leg(s).  You may remove them at night for sleeping.      Follow-up Information    [facility], [facility] Follow up.   Specialty:  Home Health Services Why:  A representative from [facility] will contact you to arrange start date and time for your therapy.  Denny Peon will be your physical therapist Contact information: [address] [place] [address] [phone]  Salvatore Marvel, MD  Follow up on 02/25/2018.   Specialty:  Orthopedic Surgery Why:  Appintment time 3:45pm Contact information: [address] [place] [address] [phone]        [facility], [facility] Follow up on 02/26/2018.   Specialty:  Orthopedic Surgery Why:  appointment time is 10 am with Junious Dresser.     Arrrive at 9:30 for paperwork Contact information: [address] [place] [address] [phone]           Anticipated LOS equal to or greater than 2 midnights due to - Age 66 and older with one or more of the following:  - Obesity  - Expected need for hospital services (PT, OT, Nursing) required for safe  discharge  - Anticipated need for postoperative skilled nursing care or inpatient rehab  - Active co-morbidities: post operative nausea and vomitting and lightheadedness OR   - Unanticipated findings during/Post Surgery: post operative nausea and vomiting and lightheadedness  - Patient is a high risk of re-admission due to: None  Signed: Rion Schnitzer J Elbony Mcclimans 02/13/2018, 7:49 AM

## 2018-02-13 NOTE — Progress Notes (Signed)
RN gave pt and daughter discharge instructions they stated understanding did not have an questions. RN removed pt IV and pt in comfortable awaiting wheel chair.

## 2018-02-13 NOTE — Progress Notes (Signed)
Occupational Therapy Treatment Patient Details Name: Heather Armstrong MRN: [id number] DOB: 1945-11-07 Today's Date: 02/13/2018    History of present illness Pt is a 72 y/o female s/p elective R TKA. PMH includes THA.    OT comments  Pt making steady progress towards OT goals, completing room and hallway level functional mobility, tub transfer to tub transfer bench using RW with minguard-supervision throughout. Pt with good family support who are engaged during treatment sessions and able to provide necessary assist after return home. Questions answered throughout. Feel pt is safe to return home from OT stand point once medically ready. Pt anticipating d/c later today.   Follow Up Recommendations  Follow surgeon's recommendation for DC plan and follow-up therapies;Supervision/Assistance - 24 hour    Equipment Recommendations  None recommended by OT          Precautions / Restrictions Precautions Precautions: Knee Precaution Comments: Reviewed knee precautions with pt.  Restrictions Weight Bearing Restrictions: Yes RLE Weight Bearing: Weight bearing as tolerated       Mobility Bed Mobility               General bed mobility comments: OOB in recliner upon arrival   Transfers Overall transfer level: Needs assistance Equipment used: Rolling walker (2 wheeled) Transfers: Sit to/from Stand Sit to Stand: Supervision         General transfer comment: intermittent VCs for safe hand placement     Balance Overall balance assessment: Needs assistance Sitting-balance support: No upper extremity supported;Feet supported Sitting balance-Leahy Scale: Good     Standing balance support: Bilateral upper extremity supported;During functional activity;No upper extremity supported Standing balance-Leahy Scale: Fair                             ADL either performed or assessed with clinical judgement   ADL Overall ADL's : Needs assistance/impaired                                 Tub/ Shower Transfer: Min guard;Tub transfer;Tub bench;Rolling walker Tub/Shower Transfer Details (indicate cue type and reason): pt return demonstrating with good carryover of education provided  Functional mobility during ADLs: Min guard;Supervision/safety;Rolling walker General ADL Comments: pt completing room and hallway functional mobility using RW; tub transfer education provided                        Cognition Arousal/Alertness: Awake/alert Behavior During Therapy: WFL for tasks assessed/performed Overall Cognitive Status: Within Functional Limits for tasks assessed                                                            Pertinent Vitals/ Pain       Pain Assessment: Faces Faces Pain Scale: Hurts a little bit Pain Location: R knee  Pain Descriptors / Indicators: Sore Pain Intervention(s): Monitored during session  Frequency  Min 2X/week        Progress Toward Goals  OT Goals(current goals can now be found in the care plan section)  Progress towards OT goals: Progressing toward goals;Goals met/education completed, patient discharged from OT  Acute Rehab OT Goals Patient Stated Goal: to go home  OT Goal Formulation: All assessment and education complete, DC therapy  Plan All goals met and education completed, patient discharged from [facility] "6 Clicks" Daily Activity     Outcome Measure   Help from another person eating meals?: None Help from another person taking care of personal grooming?: None Help from another person toileting, which includes using toliet, bedpan, or urinal?: A Little Help from another person bathing (including washing, rinsing, drying)?: A Little Help from another person to put on and taking off regular upper body clothing?: None Help from another person to  put on and taking off regular lower body clothing?: A Little 6 Click Score: 21    End of Session Equipment Utilized During Treatment: Gait belt;Rolling walker  OT Visit Diagnosis: Other abnormalities of gait and mobility (R26.89)   Activity Tolerance Patient tolerated treatment well   Patient Left in chair;with call bell/phone within reach;with family/visitor present;with nursing/sitter in room   Nurse Communication Mobility status        Time: 1540-0867 OT Time Calculation (min): 17 min  Charges: OT General Charges $OT Visit: 1 Visit OT Treatments $Self Care/Home Management : 8-22 mins  Heather Armstrong, OT Pager [phone] 02/13/2018   Heather Armstrong 02/13/2018, 1:09 PM

## 2018-02-13 NOTE — Progress Notes (Signed)
Physical Therapy Treatment Patient Details Name: Heather Armstrong MRN: [id number] DOB: Nov 13, 1945 Today's Date: 02/13/2018    History of Present Illness Pt is a 72 y/o female s/p elective R TKA. PMH includes THA.     PT Comments    Patient is making good progress with PT.  From a mobility standpoint anticipate patient will be ready for DC home when medically ready.    Follow Up Recommendations  Follow surgeon's recommendation for DC plan and follow-up therapies;Supervision for mobility/OOB     Equipment Recommendations  None recommended by PT    Recommendations for Other Services OT consult     Precautions / Restrictions Precautions Precautions: Knee Precaution Booklet Issued: Yes (comment) Precaution Comments: Reviewed knee precautions with pt.  Restrictions Weight Bearing Restrictions: Yes RLE Weight Bearing: Weight bearing as tolerated    Mobility  Bed Mobility Overal bed mobility: Modified Independent             General bed mobility comments: increased time and effort; no physical assist needed  Transfers Overall transfer level: Needs assistance Equipment used: Rolling walker (2 wheeled) Transfers: Sit to/from Stand Sit to Stand: Supervision         General transfer comment: safe hand placement demonstrated  Ambulation/Gait Ambulation/Gait assistance: Supervision Ambulation Distance (Feet): 250 Feet Assistive device: Rolling walker (2 wheeled) Gait Pattern/deviations: Step-through pattern;Decreased stride length;Antalgic;Decreased weight shift to right Gait velocity: Decreased    General Gait Details: cues for safe use of AD; slight knee instability noted at times   Stairs     Stair Management: No rails;Step to pattern;Backwards;With walker Number of Stairs: (2 steps X2) General stair comments: cues for sequencing and technique; husband assisted to stabilize RW  Wheelchair Mobility    Modified Rankin (Stroke Patients Only)        Balance     Sitting balance-Leahy Scale: Good       Standing balance-Leahy Scale: Fair                              Cognition Arousal/Alertness: Awake/alert Behavior During Therapy: WFL for tasks assessed/performed Overall Cognitive Status: Within Functional Limits for tasks assessed                                        Exercises      General Comments        Pertinent Vitals/Pain Pain Assessment: Faces Faces Pain Scale: Hurts little more Pain Location: R knee  Pain Descriptors / Indicators: Sore Pain Intervention(s): Monitored during session;Premedicated before session;Repositioned    Home Living                      Prior Function            PT Goals (current goals can now be found in the care plan section) Acute Rehab PT Goals Patient Stated Goal: to go home  PT Goal Formulation: With patient Time For Goal Achievement: 02/25/18 Potential to Achieve Goals: Good Progress towards PT goals: Progressing toward goals    Frequency    7X/week      PT Plan Current plan remains appropriate    Co-evaluation              AM-PAC PT "6 Clicks" Daily Activity  Outcome Measure  Difficulty turning over in bed (including adjusting bedclothes, sheets  and blankets)?: None Difficulty moving from lying on back to sitting on the side of the bed? : A Little Difficulty sitting down on and standing up from a chair with arms (e.g., wheelchair, bedside commode, etc,.)?: A Lot Help needed moving to and from a bed to chair (including a wheelchair)?: None Help needed walking in hospital room?: A Little Help needed climbing 3-5 steps with a railing? : A Little 6 Click Score: 19    End of Session Equipment Utilized During Treatment: Gait belt Activity Tolerance: Patient tolerated treatment well Patient left: with call bell/phone within reach;in bed;with family/visitor present;in CPM Nurse Communication: Mobility status PT Visit  Diagnosis: Other abnormalities of gait and mobility (R26.89);Pain Pain - Right/Left: Right Pain - part of body: Knee     Time: 0820-0840 PT Time Calculation (min) (ACUTE ONLY): 20 min  Charges:  $Gait Training: 8-22 mins                    G Codes:       Heather Armstrong, PTA Pager: [phone]) [phone]     Carolynne EdouardKellyn R [place] 02/13/2018, 9:01 AM

## 2018-06-07 ENCOUNTER — Other Ambulatory Visit: Payer: Self-pay | Admitting: Nurse Practitioner

## 2018-06-07 DIAGNOSIS — I6523 Occlusion and stenosis of bilateral carotid arteries: Secondary | ICD-10-CM

## 2018-06-17 ENCOUNTER — Other Ambulatory Visit: Payer: Medicare HMO

## 2018-06-19 ENCOUNTER — Ambulatory Visit (HOSPITAL_BASED_OUTPATIENT_CLINIC_OR_DEPARTMENT_OTHER): Payer: Medicare HMO

## 2018-06-24 ENCOUNTER — Ambulatory Visit (HOSPITAL_BASED_OUTPATIENT_CLINIC_OR_DEPARTMENT_OTHER)
Admission: RE | Admit: 2018-06-24 | Discharge: 2018-06-24 | Disposition: A | Discharge status: Home / Self Care | Payer: Medicare HMO | Source: Ambulatory Visit | Attending: Nurse Practitioner | Admitting: Nurse Practitioner

## 2018-06-24 DIAGNOSIS — I6523 Occlusion and stenosis of bilateral carotid arteries: Secondary | ICD-10-CM | POA: Diagnosis present

## 2020-08-14 IMAGING — US US CAROTID DUPLEX BILAT
1 series · 13 of 24 positions shown · non-contrast
Comparison: 09/26/2013

CLINICAL DATA: Arteriosclerosis of carotid artery follow-up

EXAM:
BILATERAL CAROTID DUPLEX ULTRASOUND
TECHNIQUE: Gray scale imaging, color Doppler and duplex ultrasound were
performed of bilateral carotid and vertebral arteries in the neck.

[Series 1: us carotid duplex bilat · 0.07mm/px · 13 of 71 slices shown]
[im 1/71]
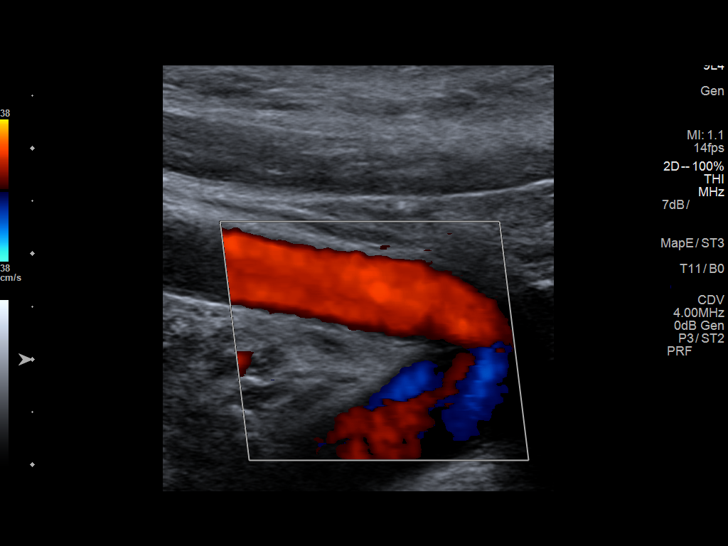
[im 7/71]
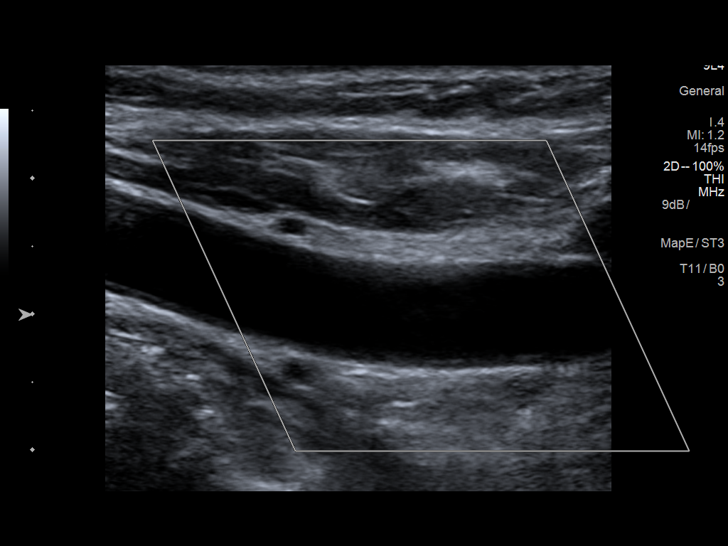
[im 13/71]
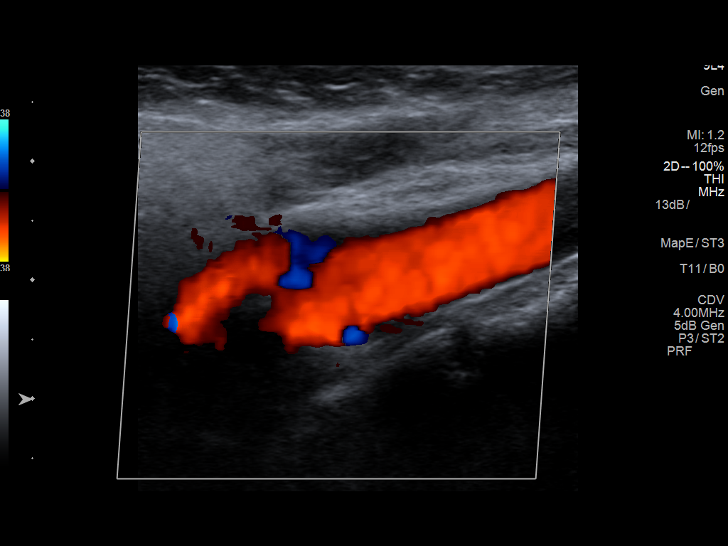
[im 19/71]
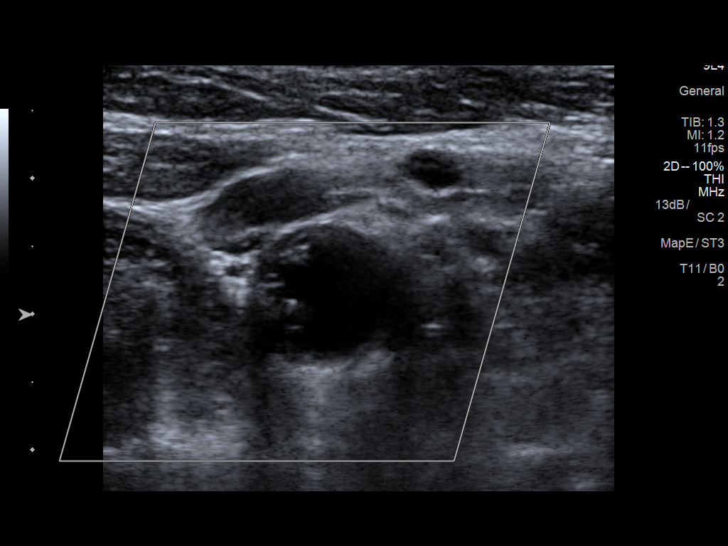
[im 25/71]
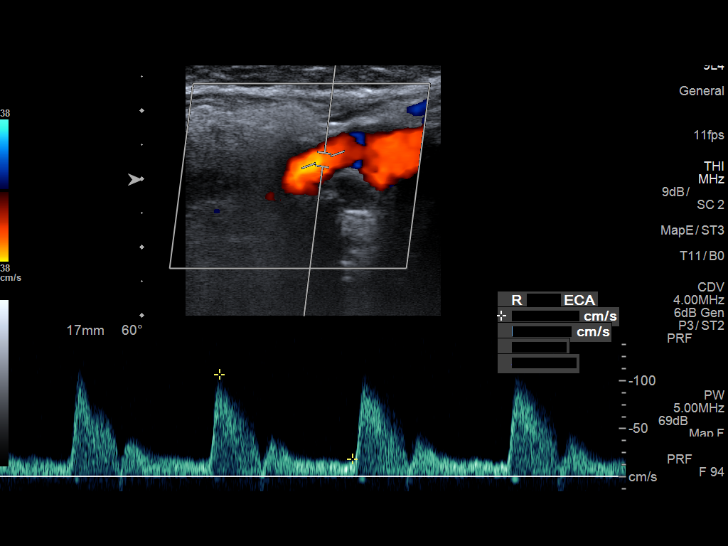
[im 31/71]
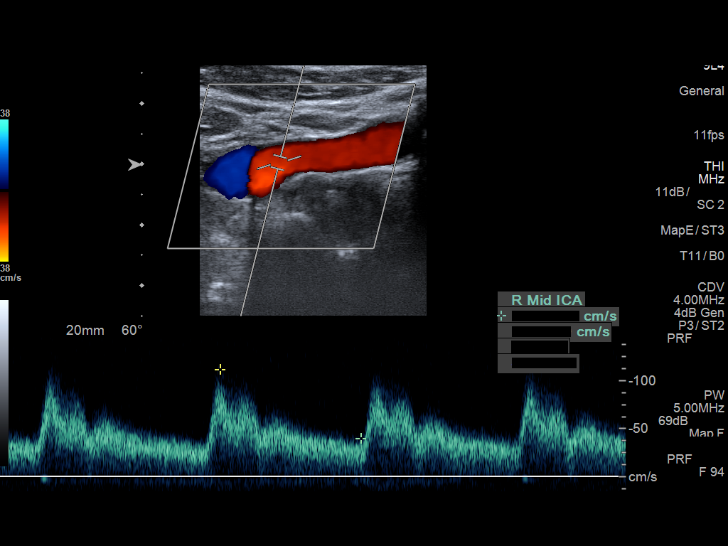
[im 37/71]
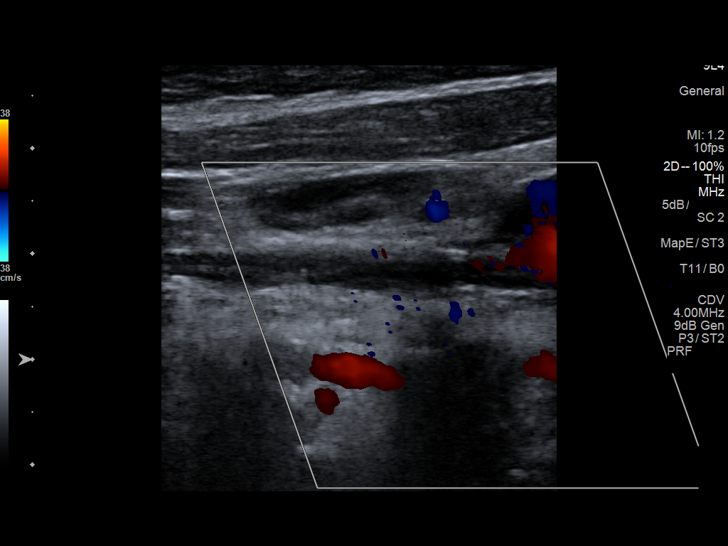
[im 40/71]
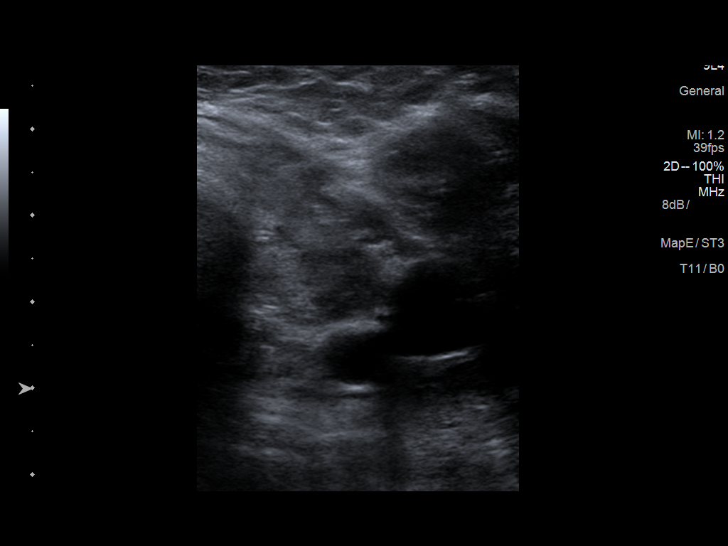
[im 46/71]
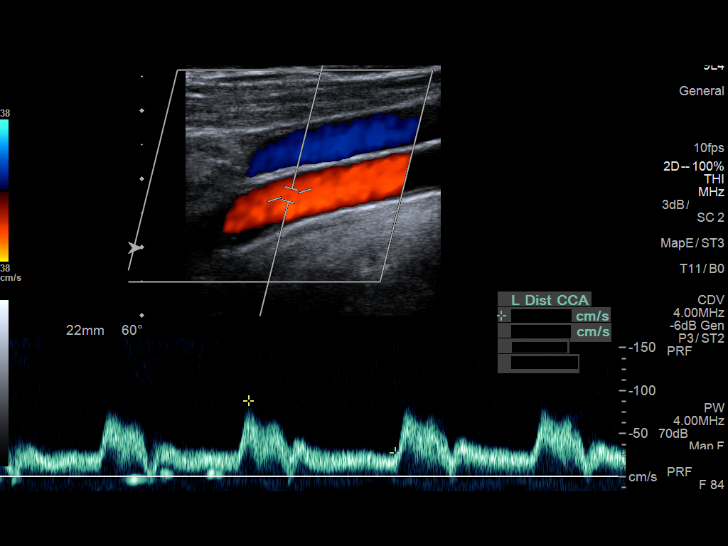
[im 52/71]
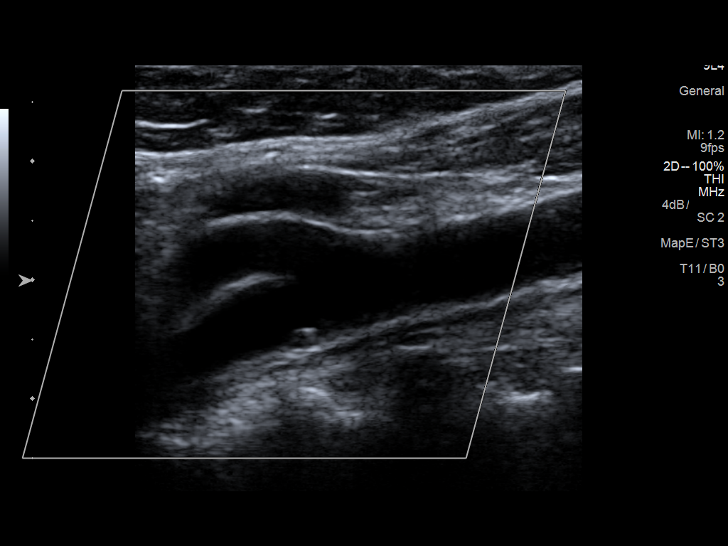
[im 58/71]
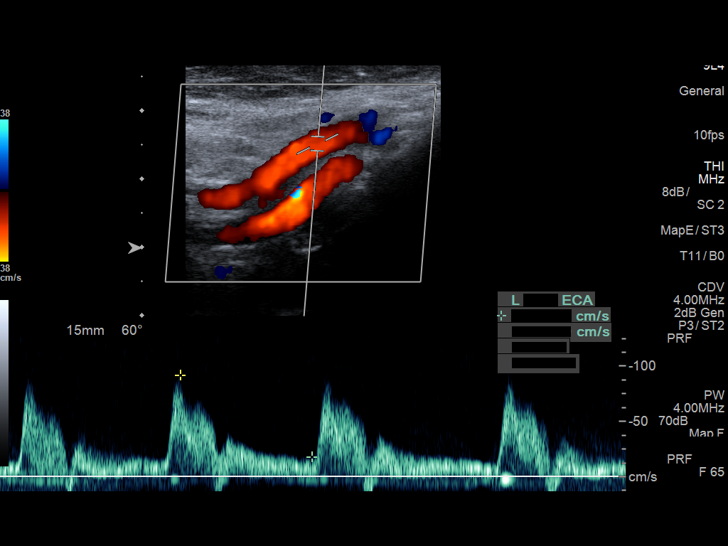
[im 64/71]
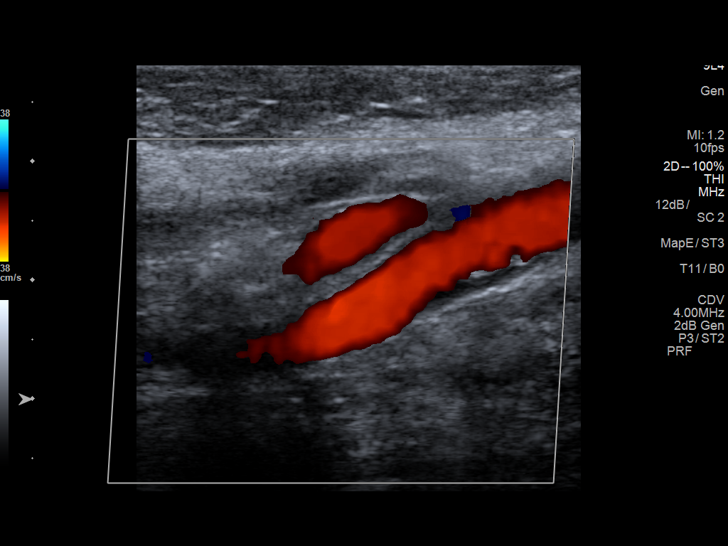
[im 71/71]
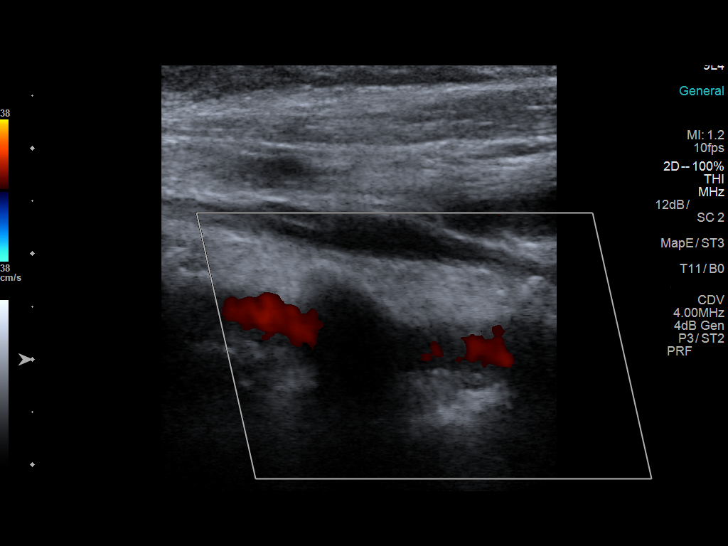

[13 of 24 positions shown; findings below may reference images not displayed]

FINDINGS: Criteria: Quantification of carotid stenosis is based on velocity
parameters that correlate the residual internal carotid diameter
with NASCET-based stenosis levels, using the diameter of the distal
internal carotid lumen as the denominator for stenosis measurement.

The following velocity measurements were obtained:

RIGHT

ICA: 132 cm/sec

CCA: 113 cm/sec

SYSTOLIC ICA/CCA RATIO:

ECA: 107 cm/sec

LEFT

ICA: 112 cm/sec

CCA: 128 cm/sec

SYSTOLIC ICA/CCA RATIO:

ECA: 92 cm/sec

RIGHT CAROTID ARTERY: Mild irregular calcified plaque in the bulb.
Low resistance internal carotid Doppler pattern is preserved.
Elevated velocity is positioned in the distal internal carotid
artery.

RIGHT VERTEBRAL ARTERY:  Antegrade.

LEFT CAROTID ARTERY: Mild irregular calcified plaque in the bulb.
Low resistance internal carotid Doppler pattern is preserved.

LEFT VERTEBRAL ARTERY:  Antegrade.

Upper extremity blood pressures: RIGHT: 123/62 LEFT: 119/66
IMPRESSION: Less than 50% stenosis in the right and left internal carotid
arteries.

## 2024-03-13 ENCOUNTER — Telehealth: Payer: Self-pay

## 2024-03-13 NOTE — Telephone Encounter (Signed)
 Left message for patient to callback to reschedule new patient appt with Dr. Ganji for 03/20/24 at 11:40 AM (OK to double book this day per Dr. Berry Bristol).

## 2024-03-20 ENCOUNTER — Encounter: Payer: Self-pay | Admitting: Cardiology

## 2024-03-20 ENCOUNTER — Ambulatory Visit: Payer: Self-pay | Attending: Cardiology | Admitting: Cardiology

## 2024-03-20 VITALS — BP 144/77 | HR 86 | Resp 16 | Ht 63.0 in | Wt 175.0 lb

## 2024-03-20 DIAGNOSIS — I10 Essential (primary) hypertension: Secondary | ICD-10-CM

## 2024-03-20 DIAGNOSIS — E7800 Pure hypercholesterolemia, unspecified: Secondary | ICD-10-CM

## 2024-03-20 DIAGNOSIS — R739 Hyperglycemia, unspecified: Secondary | ICD-10-CM | POA: Diagnosis not present

## 2024-03-20 DIAGNOSIS — R000 Tachycardia, unspecified: Secondary | ICD-10-CM

## 2024-03-20 MED ORDER — CLONIDINE HCL 0.2 MG PO TABS: 0.2000 mg | ORAL_TABLET | Freq: Every evening | ORAL | 0 refills | Status: DC

## 2024-03-20 NOTE — Patient Instructions (Addendum)
 Medication Instructions:  Your physician has recommended you make the following change in your medication: Resume Clonidine --0.2 mg by mouth every evening  *If you need a refill on your cardiac medications before your next appointment, please call your pharmacy*  Lab Work: none If you have labs (blood work) drawn today and your tests are completely normal, you will receive your results only by: MyChart Message (if you have MyChart) OR A paper copy in the mail If you have any lab test that is abnormal or we need to change your treatment, we will call you to review the results.  Testing/Procedures: none  Follow-Up: At [facility], you and your health needs are our priority.  As part of our continuing mission to provide you with exceptional heart care, our providers are all part of one team.  This team includes your primary Cardiologist (physician) and Advanced Practice Providers or APPs (Physician Assistants and Nurse Practitioners) who all work together to provide you with the care you need, when you need it.  Your next appointment:   As needed Provider:   Knox Perl, MD    We recommend signing up for the patient portal called "MyChart".  Sign up information is provided on this After Visit Summary.  MyChart is used to connect with patients for Virtual Visits (Telemedicine).  Patients are able to view lab/test results, encounter notes, upcoming appointments, etc.  Non-urgent messages can be sent to your provider as well.   To learn more about what you can do with MyChart, go to [email or web address].   Other Instructions

## 2024-03-20 NOTE — Progress Notes (Signed)
 Cardiology Office Note:  .   Date:  03/20/2024  ID:  Heather Armstrong, DOB 01/13/1946, MRN [id number] PCP: Emaline Handsome, MD  [facility] Providers Cardiologist:  Knox Perl, MD   History of Present Illness: .   Heather Armstrong is a 78 y.o. female patient with hypertension, hypercholesterolemia, hyperglycemia, mild obesity referred to me for evaluation of palpitations.  Patient is very active, was working in the yard when she had an episode of fast heartbeat that lasted for a few minutes with no recurrence.  Presently asymptomatic with regard to chest pain, dyspnea or palpitations.  However recently her blood pressure medication was changed from clonidine  that she been taking for several years to Benicar and since that she was started noticed perimenopausal symptoms, fatigue, elevated heart rate.  No dizziness or syncope. Labs   External Labs:  Labs 03/04/2024:  Total cholesterol 180, triglycerides 193, HDL 51, LDL 96.  A1c 6.1%.  TSH normal at 4.440 on 06/26/2023.  Serum glucose 100 mg, BUN 12, creatinine 0.78, electrolytes normal, EGFR 78 mL.  ROS  Review of Systems  Cardiovascular:  Negative for chest pain, dyspnea on exertion and leg swelling.    Physical Exam:   VS:  BP (!) 144/77 (BP Location: Left Arm, Patient Position: Sitting, Cuff Size: Normal)   Pulse 86   Resp 16   Ht 5\' 3"  (1.6 m)   Wt 175 lb (79.4 kg)   SpO2 94%   BMI 31.00 kg/m    Wt Readings from Last 3 Encounters:  03/20/24 175 lb (79.4 kg)  02/11/18 191 lb (86.6 kg)  01/31/18 191 lb 6 oz (86.8 kg)    Physical Exam Neck:     Vascular: No carotid bruit or JVD.  Cardiovascular:     Rate and Rhythm: Normal rate and regular rhythm.     Pulses: Intact distal pulses.     Heart sounds: Normal heart sounds. No murmur heard.    No gallop.  Pulmonary:     Effort: Pulmonary effort is normal.     Breath sounds: Normal breath sounds.  Abdominal:     General: Bowel sounds are normal.      Palpations: Abdomen is soft.  Musculoskeletal:     Right lower leg: No edema.     Left lower leg: No edema.    Studies Reviewed: .    Carotid artery duplex from 2014 and 2019 reviewed, mild atherosclerosis.  EKG:    EKG Interpretation Date/Time:  Thursday Mar 20 2024 11:51:05 EDT Ventricular Rate:  100 PR Interval:  186 QRS Duration:  70 QT Interval:  330 QTC Calculation: 425 R Axis:   -15  Text Interpretation: EKG 03/20/2024: Sinus tachycardia at rate of 100 bpm, poor R wave progression, probably normal variant.  Nonspecific T abnormality.  Compared to 07/01/2010, sinus tachycardia otherwise no change. Confirmed by Dannisha Eckmann, Jagadeesh ([id number]) on 03/20/2024 12:26:53 PM    Medications and allergies    Allergies  Allergen Reactions   Penicillins Other (See Comments)    Angioedema Because of a history of documented adverse serious drug reaction;Medi Alert bracelet  is recommended   Tramadol Hcl Itching    REACTION: dizzy and uneasy feeling   Tramadol Hcl Other (See Comments)    REACTION: dizzy and uneasy feeling     Current Outpatient Medications:    atorvastatin  (LIPITOR) 10 MG tablet, Take 1 tablet by mouth daily., Disp: , Rfl:    Cholecalciferol  (VITAMIN D ) 2000 units CAPS, Take 4,000  Units by mouth daily. , Disp: , Rfl:    cloNIDine  (CATAPRES ) 0.2 MG tablet, Take 1 tablet (0.2 mg total) by mouth every evening., Disp: 90 tablet, Rfl: 0   fluticasone (FLONASE) 50 MCG/ACT nasal spray, Place 1 spray into both nostrils daily., Disp: , Rfl:    levocetirizine (XYZAL) 5 MG tablet, Take 1 tablet by mouth every evening., Disp: , Rfl:    Multiple Vitamins-Minerals (MULTIVITAMIN PO), Take 1 tablet by mouth daily., Disp: , Rfl:    polyethylene glycol (MIRALAX  / GLYCOLAX ) packet, 17grams in 6 oz of drink twice a day until bowel movement.  LAXITIVE.  Restart if two days since last bowel movement, Disp: 14 each, Rfl: 0   Meds ordered this encounter  Medications   cloNIDine  (CATAPRES )  0.2 MG tablet    Sig: Take 1 tablet (0.2 mg total) by mouth every evening.    Dispense:  90 tablet    Refill:  0    Refills to Arlinda Lais, [facility]     Medications Discontinued During This Encounter  Medication Reason   aspirin  EC 325 MG EC tablet Dose change   atorvastatin  (LIPITOR) 20 MG tablet Dose change   olmesartan (BENICAR) 5 MG tablet Side effect (s)   docusate sodium  (COLACE) 100 MG capsule Change in therapy   gabapentin  (NEURONTIN ) 300 MG capsule Completed Course   magnesium  oxide (MAG-OX) 400 MG tablet Patient Preference   oxyCODONE  (OXY IR/ROXICODONE ) 5 MG immediate release tablet Completed Course   cloNIDine  (CATAPRES ) 0.2 MG tablet      ASSESSMENT AND PLAN: .    1. Tachycardia (Primary) Her symptoms are tachycardia is related to sudden withdrawal of a relatively high dose of clonidine  that she was on at 0.2 mg daily.  This was started for hypertension and also for perimenopausal symptoms many years ago and 7 withdrawal of a high dose led to elevated heart rate, return of hot flashes and not feeling well.  I will reinitiate clonidine  at 0.2 mg daily and blood pressure and symptoms of perimenopausal issues were well-controlled.  Will discontinue olmesartan for now.  Do not think she needs any further follow-up or evaluation for tachycardia.  - EKG 12-Lead - cloNIDine  (CATAPRES ) 0.2 MG tablet; Take 1 tablet (0.2 mg total) by mouth every evening.  Dispense: 90 tablet; Refill: 0  2. Pure hypercholesterolemia I reviewed her carotid artery duplex from previous, she has mild carotid arthrosclerosis, continue present dose statin at 10 mg of Lipitor.  External labs reviewed, LDL <100.  3. Primary hypertension Blood pressure is elevated at 140/77 mmHg, heart rate is also elevated, suspect sudden withdrawal of clonidine .  Please start clonidine  and follow-up on blood pressure. - cloNIDine  (CATAPRES ) 0.2 MG tablet; Take 1 tablet (0.2 mg total) by mouth every evening.  Dispense: 90  tablet; Refill: 0  4. Hyperglycemia Respect of hypoglycemia discussed with the patient, she is mildly obese, weight loss discussed.  I do not think she needs any further cardiac workup in the absence of symptoms especially chest pain or dyspnea and I will see her back on a as needed basis in view of normal EKG and physical examination.  Other orders - atorvastatin  (LIPITOR) 10 MG tablet; Take 1 tablet by mouth daily. - fluticasone (FLONASE) 50 MCG/ACT nasal spray; Place 1 spray into both nostrils daily. - levocetirizine (XYZAL) 5 MG tablet; Take 1 tablet by mouth every evening.  Signed,  Knox Perl, MD, [facility] 03/20/2024, 8:16 PM [facility] [address] [place], [place]  [address] Phone: [phone]. Fax:  [phone]

## 2024-06-14 ENCOUNTER — Other Ambulatory Visit: Payer: Self-pay | Admitting: Cardiology

## 2024-06-14 DIAGNOSIS — I10 Essential (primary) hypertension: Secondary | ICD-10-CM

## 2024-06-14 DIAGNOSIS — R000 Tachycardia, unspecified: Secondary | ICD-10-CM

## 2024-06-16 NOTE — Telephone Encounter (Signed)
 One 90 day refill sent to pharmacy. Further refills should come from PCP

## 2024-06-27 ENCOUNTER — Ambulatory Visit: Admitting: Cardiology

## 2024-09-16 ENCOUNTER — Other Ambulatory Visit: Payer: Self-pay | Admitting: Cardiology

## 2024-09-16 DIAGNOSIS — R000 Tachycardia, unspecified: Secondary | ICD-10-CM

## 2024-09-16 DIAGNOSIS — I10 Essential (primary) hypertension: Secondary | ICD-10-CM
